# Patient Record
Sex: Female | Born: 1975
Health system: Southern US, Community
[De-identification: ages and names within clinical notes are randomized; demographics above are authoritative.]

## PROBLEM LIST (undated history)

## (undated) DIAGNOSIS — O09529 Supervision of elderly multigravida, unspecified trimester: Secondary | ICD-10-CM

## (undated) DIAGNOSIS — Z87442 Personal history of urinary calculi: Secondary | ICD-10-CM

## (undated) DIAGNOSIS — F32A Depression, unspecified: Secondary | ICD-10-CM

## (undated) DIAGNOSIS — L309 Dermatitis, unspecified: Secondary | ICD-10-CM

## (undated) DIAGNOSIS — R87629 Unspecified abnormal cytological findings in specimens from vagina: Secondary | ICD-10-CM

## (undated) DIAGNOSIS — E039 Hypothyroidism, unspecified: Secondary | ICD-10-CM

## (undated) DIAGNOSIS — F329 Major depressive disorder, single episode, unspecified: Secondary | ICD-10-CM

## (undated) HISTORY — DX: Major depressive disorder, single episode, unspecified: F32.9

## (undated) HISTORY — DX: Personal history of urinary calculi: Z87.442

## (undated) HISTORY — DX: Depression, unspecified: F32.A

## (undated) HISTORY — DX: Hypothyroidism, unspecified: E03.9

## (undated) HISTORY — DX: Supervision of elderly multigravida, unspecified trimester: O09.529

## (undated) HISTORY — PX: GYNECOLOGIC CRYOSURGERY: SHX857

## (undated) HISTORY — DX: Dermatitis, unspecified: L30.9

## (undated) HISTORY — DX: Unspecified abnormal cytological findings in specimens from vagina: R87.629

## (undated) HISTORY — PX: WISDOM TOOTH EXTRACTION: SHX21

---

## 2000-10-16 ENCOUNTER — Other Ambulatory Visit: Admission: RE | Admit: 2000-10-16 | Discharge: 2000-10-16 | Payer: Self-pay | Admitting: *Deleted

## 2001-08-19 ENCOUNTER — Other Ambulatory Visit: Admission: RE | Admit: 2001-08-19 | Discharge: 2001-08-19 | Payer: Self-pay | Admitting: *Deleted

## 2002-09-16 ENCOUNTER — Other Ambulatory Visit: Admission: RE | Admit: 2002-09-16 | Discharge: 2002-09-16 | Payer: Self-pay | Admitting: *Deleted

## 2004-09-18 ENCOUNTER — Other Ambulatory Visit: Admission: RE | Admit: 2004-09-18 | Discharge: 2004-09-18 | Payer: Self-pay | Admitting: Obstetrics and Gynecology

## 2008-05-24 ENCOUNTER — Encounter: Admission: RE | Admit: 2008-05-24 | Discharge: 2008-05-24 | Payer: Self-pay | Admitting: Family Medicine

## 2010-04-29 NOTE — L&D Delivery Note (Signed)
Delivery Note  SVD viable female Apgars 9,9 over 2nd deg MLE.  Placenta delivered spontaneously intact with 3VC. Repair with 2-0 Chromic with good support and hemostasis noted and R/V exam confirms.  PH art was done but clotted.  [place] cord blood was sent.  Mother and baby were doing well.  EBL 450  Candice Camp, MD

## 2010-08-17 LAB — ABO/RH: RH Type: NEGATIVE

## 2010-08-17 LAB — RPR: RPR: NONREACTIVE

## 2010-08-17 LAB — ANTIBODY SCREEN: Antibody Screen: NEGATIVE

## 2010-08-17 LAB — HEPATITIS B SURFACE ANTIGEN: Hepatitis B Surface Ag: NEGATIVE

## 2011-01-31 ENCOUNTER — Institutional Professional Consult (permissible substitution): Payer: Medicaid Other | Admitting: Pediatrics

## 2011-03-27 ENCOUNTER — Telehealth (HOSPITAL_COMMUNITY): Payer: Self-pay | Admitting: *Deleted

## 2011-03-27 ENCOUNTER — Encounter (HOSPITAL_COMMUNITY): Payer: Self-pay | Admitting: *Deleted

## 2011-03-27 NOTE — Telephone Encounter (Signed)
,  Preadmission screen  

## 2011-04-01 ENCOUNTER — Inpatient Hospital Stay (HOSPITAL_COMMUNITY)
Admission: AD | Admit: 2011-04-01 | Discharge: 2011-04-04 | DRG: 373 | Disposition: A | Discharge status: Home / Self Care | Payer: BC Managed Care – PPO | Source: Ambulatory Visit | Attending: Obstetrics and Gynecology | Admitting: Obstetrics and Gynecology

## 2011-04-01 ENCOUNTER — Encounter (HOSPITAL_COMMUNITY): Payer: Self-pay

## 2011-04-01 ENCOUNTER — Inpatient Hospital Stay (HOSPITAL_COMMUNITY): Admission: RE | Admit: 2011-04-01 | Payer: Medicaid Other | Source: Ambulatory Visit

## 2011-04-01 DIAGNOSIS — O09519 Supervision of elderly primigravida, unspecified trimester: Secondary | ICD-10-CM | POA: Diagnosis present

## 2011-04-01 DIAGNOSIS — O480 Post-term pregnancy: Principal | ICD-10-CM | POA: Diagnosis present

## 2011-04-01 DIAGNOSIS — E039 Hypothyroidism, unspecified: Secondary | ICD-10-CM | POA: Diagnosis present

## 2011-04-01 DIAGNOSIS — E079 Disorder of thyroid, unspecified: Secondary | ICD-10-CM | POA: Diagnosis present

## 2011-04-01 LAB — CBC
HCT: 34 % — ABNORMAL LOW (ref 36.0–46.0)
MCV: 94.7 fL (ref 78.0–100.0)
Platelets: 243 10*3/uL (ref 150–400)
RBC: 3.59 MIL/uL — ABNORMAL LOW (ref 3.87–5.11)
RDW: 12.8 % (ref 11.5–15.5)
WBC: 10.8 10*3/uL — ABNORMAL HIGH (ref 4.0–10.5)

## 2011-04-01 MED ORDER — CITRIC ACID-SODIUM CITRATE 334-500 MG/5ML PO SOLN: 30.0000 mL | ORAL | Status: DC | PRN

## 2011-04-01 MED ORDER — LACTATED RINGERS IV SOLN: 500.0000 mL | INTRAVENOUS | Status: DC | PRN

## 2011-04-01 MED ORDER — TERBUTALINE SULFATE 1 MG/ML IJ SOLN: 0.2500 mg | Freq: Once | INTRAMUSCULAR | Status: AC | PRN

## 2011-04-01 MED ORDER — FLEET ENEMA 7-19 GM/118ML RE ENEM: 1.0000 | ENEMA | RECTAL | Status: DC | PRN

## 2011-04-01 MED ORDER — LACTATED RINGERS IV SOLN
INTRAVENOUS | Status: DC
  Administered 2011-04-01 – 2011-04-02 (×4): via INTRAVENOUS

## 2011-04-01 MED ORDER — OXYCODONE-ACETAMINOPHEN 5-325 MG PO TABS: 2.0000 | ORAL_TABLET | ORAL | Status: DC | PRN

## 2011-04-01 MED ORDER — ACETAMINOPHEN 325 MG PO TABS: 650.0000 mg | ORAL_TABLET | ORAL | Status: DC | PRN

## 2011-04-01 MED ORDER — IBUPROFEN 600 MG PO TABS: 600.0000 mg | ORAL_TABLET | Freq: Four times a day (QID) | ORAL | Status: DC | PRN

## 2011-04-01 MED ORDER — OXYTOCIN BOLUS FROM INFUSION
500.0000 mL | Freq: Once | INTRAVENOUS | Status: DC
  Filled 2011-04-01: qty 500
  Filled 2011-04-01: qty 1000

## 2011-04-01 MED ORDER — OXYTOCIN 20 UNITS IN LACTATED RINGERS INFUSION - SIMPLE
125.0000 mL/h | Freq: Once | INTRAVENOUS | Status: AC
  Administered 2011-04-02: 999 mL/h via INTRAVENOUS
  Filled 2011-04-01: qty 1000

## 2011-04-01 MED ORDER — LIDOCAINE HCL (PF) 1 % IJ SOLN
30.0000 mL | INTRAMUSCULAR | Status: DC | PRN
  Administered 2011-04-02: 30 mL via SUBCUTANEOUS
  Filled 2011-04-01: qty 30

## 2011-04-01 MED ORDER — ONDANSETRON HCL 4 MG/2ML IJ SOLN: 4.0000 mg | Freq: Four times a day (QID) | INTRAMUSCULAR | Status: DC | PRN

## 2011-04-01 MED ORDER — MISOPROSTOL 25 MCG QUARTER TABLET
25.0000 ug | ORAL_TABLET | ORAL | Status: DC | PRN
  Administered 2011-04-01 – 2011-04-02 (×3): 25 ug via VAGINAL
  Filled 2011-04-01 (×3): qty 0.25

## 2011-04-01 NOTE — Plan of Care (Signed)
Problem: Consults Goal: Birthing Suites Patient Information Press F2 to bring up selections list  Outcome: Completed/Met Date Met:  04/01/11  Pt > [redacted] weeks EGA and Inpatient induction

## 2011-04-02 ENCOUNTER — Inpatient Hospital Stay (HOSPITAL_COMMUNITY): Payer: BC Managed Care – PPO | Admitting: Anesthesiology

## 2011-04-02 ENCOUNTER — Encounter (HOSPITAL_COMMUNITY): Payer: Self-pay | Admitting: Anesthesiology

## 2011-04-02 ENCOUNTER — Encounter (HOSPITAL_COMMUNITY): Payer: Self-pay | Admitting: *Deleted

## 2011-04-02 MED ORDER — DIPHENHYDRAMINE HCL 50 MG/ML IJ SOLN: 12.5000 mg | INTRAMUSCULAR | Status: DC | PRN

## 2011-04-02 MED ORDER — BENZOCAINE-MENTHOL 20-0.5 % EX AERO: 1.0000 "application " | INHALATION_SPRAY | CUTANEOUS | Status: DC | PRN

## 2011-04-02 MED ORDER — DIBUCAINE 1 % RE OINT
1.0000 "application " | TOPICAL_OINTMENT | RECTAL | Status: DC | PRN
  Filled 2011-04-02: qty 28

## 2011-04-02 MED ORDER — METHYLERGONOVINE MALEATE 0.2 MG/ML IJ SOLN
INTRAMUSCULAR | Status: AC
  Administered 2011-04-02: 0.2 mg via INTRAMUSCULAR
  Filled 2011-04-02: qty 1

## 2011-04-02 MED ORDER — MEDROXYPROGESTERONE ACETATE 150 MG/ML IM SUSP: 150.0000 mg | INTRAMUSCULAR | Status: DC | PRN

## 2011-04-02 MED ORDER — ZOLPIDEM TARTRATE 10 MG PO TABS: 10.0000 mg | ORAL_TABLET | Freq: Every evening | ORAL | Status: DC | PRN

## 2011-04-02 MED ORDER — TETANUS-DIPHTH-ACELL PERTUSSIS 5-2.5-18.5 LF-MCG/0.5 IM SUSP: 0.5000 mL | Freq: Once | INTRAMUSCULAR | Status: DC

## 2011-04-02 MED ORDER — LEVOTHYROXINE SODIUM 50 MCG PO TABS
50.0000 ug | ORAL_TABLET | Freq: Every day | ORAL | Status: DC
  Administered 2011-04-03 – 2011-04-04 (×2): 50 ug via ORAL
  Filled 2011-04-02 (×3): qty 1

## 2011-04-02 MED ORDER — ZOLPIDEM TARTRATE 5 MG PO TABS: 5.0000 mg | ORAL_TABLET | Freq: Every evening | ORAL | Status: DC | PRN

## 2011-04-02 MED ORDER — PHENYLEPHRINE 40 MCG/ML (10ML) SYRINGE FOR IV PUSH (FOR BLOOD PRESSURE SUPPORT): 80.0000 ug | PREFILLED_SYRINGE | INTRAVENOUS | Status: DC | PRN

## 2011-04-02 MED ORDER — EPHEDRINE 5 MG/ML INJ
10.0000 mg | INTRAVENOUS | Status: DC | PRN
  Filled 2011-04-02: qty 4

## 2011-04-02 MED ORDER — IBUPROFEN 600 MG PO TABS
600.0000 mg | ORAL_TABLET | Freq: Four times a day (QID) | ORAL | Status: DC
  Administered 2011-04-03 – 2011-04-04 (×6): 600 mg via ORAL
  Filled 2011-04-02 (×5): qty 1

## 2011-04-02 MED ORDER — WITCH HAZEL-GLYCERIN EX PADS: 1.0000 "application " | MEDICATED_PAD | CUTANEOUS | Status: DC | PRN

## 2011-04-02 MED ORDER — FENTANYL 2.5 MCG/ML BUPIVACAINE 1/10 % EPIDURAL INFUSION (WH - ANES)
14.0000 mL/h | INTRAMUSCULAR | Status: DC
  Administered 2011-04-02 (×2): 14 mL/h via EPIDURAL
  Filled 2011-04-02 (×3): qty 60

## 2011-04-02 MED ORDER — TERBUTALINE SULFATE 1 MG/ML IJ SOLN: 0.2500 mg | Freq: Once | INTRAMUSCULAR | Status: DC | PRN

## 2011-04-02 MED ORDER — PRENATAL PLUS 27-1 MG PO TABS
1.0000 | ORAL_TABLET | Freq: Every day | ORAL | Status: DC
  Administered 2011-04-03 – 2011-04-04 (×2): 1 via ORAL
  Filled 2011-04-02 (×2): qty 1

## 2011-04-02 MED ORDER — OXYCODONE-ACETAMINOPHEN 5-325 MG PO TABS: 1.0000 | ORAL_TABLET | ORAL | Status: DC | PRN

## 2011-04-02 MED ORDER — PRENATAL PLUS 27-1 MG PO TABS: 1.0000 | ORAL_TABLET | Freq: Every day | ORAL | Status: DC

## 2011-04-02 MED ORDER — SENNOSIDES-DOCUSATE SODIUM 8.6-50 MG PO TABS
2.0000 | ORAL_TABLET | Freq: Every day | ORAL | Status: DC
  Administered 2011-04-03: 2 via ORAL

## 2011-04-02 MED ORDER — PHENYLEPHRINE 40 MCG/ML (10ML) SYRINGE FOR IV PUSH (FOR BLOOD PRESSURE SUPPORT)
80.0000 ug | PREFILLED_SYRINGE | INTRAVENOUS | Status: DC | PRN
  Filled 2011-04-02: qty 5

## 2011-04-02 MED ORDER — LIDOCAINE HCL 1.5 % IJ SOLN
INTRAMUSCULAR | Status: DC | PRN
  Administered 2011-04-02 (×2): 5 mL via EPIDURAL

## 2011-04-02 MED ORDER — SIMETHICONE 80 MG PO CHEW: 80.0000 mg | CHEWABLE_TABLET | ORAL | Status: DC | PRN

## 2011-04-02 MED ORDER — LANOLIN HYDROUS EX OINT: TOPICAL_OINTMENT | CUTANEOUS | Status: DC | PRN

## 2011-04-02 MED ORDER — ONDANSETRON HCL 4 MG PO TABS
4.0000 mg | ORAL_TABLET | ORAL | Status: DC | PRN
  Administered 2011-04-02: 4 mg via ORAL
  Filled 2011-04-02: qty 1

## 2011-04-02 MED ORDER — ONDANSETRON HCL 4 MG/2ML IJ SOLN
4.0000 mg | INTRAMUSCULAR | Status: DC | PRN
  Filled 2011-04-02: qty 2

## 2011-04-02 MED ORDER — OXYTOCIN 20 UNITS IN LACTATED RINGERS INFUSION - SIMPLE
1.0000 m[IU]/min | INTRAVENOUS | Status: DC
  Administered 2011-04-02: 2 m[IU]/min via INTRAVENOUS
  Administered 2011-04-02: 333 m[IU]/min via INTRAVENOUS

## 2011-04-02 MED ORDER — LEVOTHYROXINE SODIUM 50 MCG PO TABS
50.0000 ug | ORAL_TABLET | Freq: Every day | ORAL | Status: DC
  Filled 2011-04-02: qty 1

## 2011-04-02 MED ORDER — FENTANYL 2.5 MCG/ML BUPIVACAINE 1/10 % EPIDURAL INFUSION (WH - ANES)
INTRAMUSCULAR | Status: DC | PRN
  Administered 2011-04-02: 14 mL/h via EPIDURAL

## 2011-04-02 MED ORDER — DIPHENHYDRAMINE HCL 25 MG PO CAPS: 25.0000 mg | ORAL_CAPSULE | Freq: Four times a day (QID) | ORAL | Status: DC | PRN

## 2011-04-02 MED ORDER — MEASLES, MUMPS & RUBELLA VAC ~~LOC~~ INJ
0.5000 mL | INJECTION | Freq: Once | SUBCUTANEOUS | Status: DC
  Filled 2011-04-02: qty 0.5

## 2011-04-02 MED ORDER — EPHEDRINE 5 MG/ML INJ: 10.0000 mg | INTRAVENOUS | Status: DC | PRN

## 2011-04-02 MED ORDER — LACTATED RINGERS IV SOLN: 500.0000 mL | Freq: Once | INTRAVENOUS | Status: DC

## 2011-04-02 NOTE — H&P (Signed)
Carly [name] is a 35 y.o. female presenting for induction of labor due to postdates.  Her pregnancy has been uncomplicated  GBS -. History OB History    Grav Para Term Preterm Abortions TAB SAB Ect Mult Living   1 0 0 0 0 0 0 0 0 0      Past Medical History  Diagnosis Date  . AMA (advanced maternal age) multigravida 35+   . Hypothyroidism   . History of kidney stones   . Eczema    Past Surgical History  Procedure Date  . Wisdom tooth extraction    Family History: family history includes Cancer in her maternal grandfather; Heart disease in her maternal grandfather; and Hypothyroidism in her paternal grandmother.  There is no history of Anesthesia problems, and Hypotension, and Malignant hyperthermia, and Pseudochol deficiency, . Social History:  reports that she has been smoking.  She has never used smokeless tobacco. She reports that she does not drink alcohol or use illicit drugs.  ROS  Dilation: 2 Effacement (%): 90 Station: -2 Exam by:: Dr. Rana Snare Blood pressure 150/82, pulse 65, temperature 98.3 F (36.8 C), temperature source Oral, resp. rate 20, height 5\' 11"  (1.803 m), weight 88.451 kg (195 lb), last menstrual period 06/19/2010. Exam Physical Exam  Prenatal labs: ABO, Rh: O/Negative/-- (04/20 0000) Antibody: Negative (04/20 0000) Rubella: Immune (04/20 0000) RPR: NON REACTIVE (12/03 2010)  HBsAg: Negative (04/20 0000)  HIV: Non-reactive (04/20 0000)  GBS: Negative (10/31 0000)   Assessment/Plan: IUP at 41` weeks Cytotec last HS AROM then pitocin Anticipate svd   Horace Wishon C 04/02/2011, 8:36 AM

## 2011-04-02 NOTE — Anesthesia Procedure Notes (Signed)
Epidural Patient location during procedure: OB Start time: 04/02/2011 9:59 AM End time: 04/02/2011 10:05 AM Reason for block: procedure for pain  Staffing Anesthesiologist: Sandrea Hughs Performed by: anesthesiologist   Preanesthetic Checklist Completed: patient identified, site marked, surgical consent, pre-op evaluation, timeout performed, IV checked, risks and benefits discussed and monitors and equipment checked  Epidural Patient position: sitting Prep: site prepped and draped and DuraPrep Patient monitoring: continuous pulse ox and blood pressure Approach: midline Injection technique: LOR air  Needle:  Needle type: Tuohy  Needle gauge: 17 G Needle length: 9 cm Needle insertion depth: 6 cm Catheter type: closed end flexible Catheter size: 19 Gauge Catheter at skin depth: 11 cm Test dose: negative and 1.5% lidocaine  Assessment Sensory level: T10 Events: blood not aspirated, injection not painful, no injection resistance, negative IV test and no paresthesia

## 2011-04-02 NOTE — Anesthesia Preprocedure Evaluation (Signed)
Anesthesia Evaluation  Patient identified by MRN, date of birth, ID band Patient awake    Reviewed: Allergy & Precautions, H&P , NPO status , Patient's Chart, lab work & pertinent test results  Airway Mallampati: I TM Distance: >3 FB Neck ROM: full    Dental No notable dental hx.    Pulmonary neg pulmonary ROS,    Pulmonary exam normal       Cardiovascular neg cardio ROS     Neuro/Psych Negative Neurological ROS  Negative Psych ROS   GI/Hepatic negative GI ROS, Neg liver ROS,   Endo/Other  Hypothyroidism   Renal/GU negative Renal ROS  Genitourinary negative   Musculoskeletal   Abdominal Normal abdominal exam  (+)   Peds negative pediatric ROS (+)  Hematology negative hematology ROS (+)   Anesthesia Other Findings   Reproductive/Obstetrics (+) Pregnancy                           Anesthesia Physical Anesthesia Plan  ASA: II  Anesthesia Plan: Epidural   Post-op Pain Management:    Induction:   Airway Management Planned:   Additional Equipment:   Intra-op Plan:   Post-operative Plan:   Informed Consent: I have reviewed the patients History and Physical, chart, labs and discussed the procedure including the risks, benefits and alternatives for the proposed anesthesia with the patient or authorized representative who has indicated his/her understanding and acceptance.     Plan Discussed with:   Anesthesia Plan Comments:         Anesthesia Quick Evaluation

## 2011-04-03 LAB — CBC
HCT: 23.7 % — ABNORMAL LOW (ref 36.0–46.0)
Hemoglobin: 8.3 g/dL — ABNORMAL LOW (ref 12.0–15.0)
MCHC: 35 g/dL (ref 30.0–36.0)

## 2011-04-03 MED ORDER — RHO D IMMUNE GLOBULIN 1500 UNIT/2ML IJ SOLN
300.0000 ug | Freq: Once | INTRAMUSCULAR | Status: AC
  Administered 2011-04-03: 300 ug via INTRAMUSCULAR
  Filled 2011-04-03: qty 2

## 2011-04-03 MED ORDER — BENZOCAINE-MENTHOL 20-0.5 % EX AERO
INHALATION_SPRAY | CUTANEOUS | Status: AC
  Filled 2011-04-03: qty 56

## 2011-04-03 NOTE — Progress Notes (Signed)
I discussed infant circumcision with mother, use of Xylocaine for dorsal penile nerve block, and risks related to bleeding, infection or need for further surgery

## 2011-04-03 NOTE — Anesthesia Postprocedure Evaluation (Signed)
  Anesthesia Post-op Note  Patient: Carly [name]  Procedure(s) Performed: * No procedures listed *  Patient Location: Mother/Baby  Anesthesia Type: Epidural  Level of Consciousness: alert  and oriented  Airway and Oxygen Therapy: Patient Spontanous Breathing  Post-op Pain: mild  Post-op Assessment: Patient's Cardiovascular Status Stable and Respiratory Function Stable  Post-op Vital Signs: stable  Complications: No apparent anesthesia complications

## 2011-04-03 NOTE — Anesthesia Postprocedure Evaluation (Signed)
Anesthesia Post Note  Patient: Carly [name]  Procedure(s) Performed: * No procedures listed *  Anesthesia type: Epidural  Patient location: Mother/Baby  Post pain: Pain level controlled  Post assessment: Post-op Vital signs reviewed  Last Vitals:  Filed Vitals:   04/03/11 0505  BP: 109/72  Pulse: 69  Temp: 36.4 C  Resp: 18    Post vital signs: Reviewed  Level of consciousness: awake  Complications: No apparent anesthesia complications

## 2011-04-03 NOTE — Progress Notes (Signed)
Post Partum Day 1 Subjective: no complaints, up ad lib, voiding and tolerating PO  Objective: Blood pressure 109/72, pulse 69, temperature 97.6 F (36.4 C), temperature source Oral, resp. rate 18, height 5\' 11"  (1.803 m), weight 88.451 kg (195 lb), last menstrual period 06/19/2010, SpO2 100.00%, unknown if currently breastfeeding.  Physical Exam:  General: alert and cooperative Lochia: appropriate Uterine Fundus: firm Perineum intact DVT Evaluation: No evidence of DVT seen on physical exam.   Basename 04/03/11 0510 04/01/11 2010  HGB 8.3* 11.5*  HCT 23.7* 34.0*    Assessment/Plan: Plan for discharge tomorrow   LOS: 2 days   Hobie Kohles G 04/03/2011, 7:53 AM

## 2011-04-04 LAB — RH IG WORKUP (INCLUDES ABO/RH)
ABO/RH(D): O NEG
Antibody Screen: POSITIVE
DAT, IgG: NEGATIVE
Fetal Screen: NEGATIVE
Unit division: 0

## 2011-04-04 NOTE — Progress Notes (Signed)
Post Partum Day 2 Subjective: no complaints, up ad lib, voiding, tolerating PO and + flatus  Objective: Blood pressure 138/86, pulse 67, temperature 98.2 F (36.8 C), temperature source Oral, resp. rate 18, height 5\' 11"  (1.803 m), weight 88.451 kg (195 lb), last menstrual period 06/19/2010, SpO2 100.00%, unknown if currently breastfeeding.  Physical Exam:  General: alert, cooperative and appears stated age Lochia: appropriate Uterine Fundus: firm Incision: not applicable DVT Evaluation: No evidence of DVT seen on physical exam.   Basename 04/03/11 0510 04/01/11 2010  HGB 8.3* 11.5*  HCT 23.7* 34.0*    Assessment/Plan: Discharge home and Breastfeeding   LOS: 3 days   Nickolis Diel L 04/04/2011, 7:43 AM

## 2011-04-04 NOTE — Discharge Summary (Signed)
Obstetric Discharge Summary Reason for Admission: onset of labor Prenatal Procedures: none Intrapartum Procedures: spontaneous vaginal delivery Postpartum Procedures: none Complications-Operative and Postpartum: none Hemoglobin  Date Value Range Status  04/03/2011 8.3* 12.0-15.0 (g/dL) Final     DELTA CHECK NOTED     REPEATED TO VERIFY     HCT  Date Value Range Status  04/03/2011 23.7* 36.0-46.0 (%) Final    Discharge Diagnoses: Term Pregnancy-delivered  Discharge Information: Date: 04/04/2011 Activity: pelvic rest Diet: routine Medications: None Condition: improved Instructions: refer to practice specific booklet Discharge to: home   Newborn Data: Live born female  Birth Weight: 7 lb 14 oz (3572 g) APGAR: 9, 9  Home with mother.  [name] L 04/04/2011, 7:44 AM

## 2013-04-29 NOTE — L&D Delivery Note (Signed)
Delivery Note At 7:11 PM a healthy female was delivered via Vaginal, Spontaneous Delivery (Presentation: Left Occiput Anterior).  APGAR: 8, 9; weight .   Placenta status: Intact, Spontaneous.  Cord: 3 vessels with the following complications: None.  Cord pH: not sent   Loose nuchal cord x 2 Anesthesia: Epidural  Episiotomy: None Lacerations: 2nd degree;Periurethral Suture Repair: 3-0 Rapide Est. Blood Loss (mL): 300  Mom to postpartum.  Baby to nursery  [facility]AND,Carly [name] M 01/26/2014, 7:42 PM

## 2013-07-07 LAB — OB RESULTS CONSOLE GC/CHLAMYDIA
Chlamydia: NEGATIVE
Gonorrhea: NEGATIVE

## 2013-07-07 LAB — OB RESULTS CONSOLE ABO/RH: RH Type: NEGATIVE

## 2013-07-07 LAB — OB RESULTS CONSOLE RPR: RPR: NONREACTIVE

## 2013-07-07 LAB — OB RESULTS CONSOLE HIV ANTIBODY (ROUTINE TESTING): HIV: NONREACTIVE

## 2013-07-07 LAB — OB RESULTS CONSOLE ANTIBODY SCREEN: Antibody Screen: NEGATIVE

## 2013-07-07 LAB — OB RESULTS CONSOLE HEPATITIS B SURFACE ANTIGEN: HEP B S AG: NEGATIVE

## 2013-07-07 LAB — OB RESULTS CONSOLE RUBELLA ANTIBODY, IGM: Rubella: IMMUNE

## 2013-11-18 ENCOUNTER — Inpatient Hospital Stay (HOSPITAL_COMMUNITY)
Admission: AD | Admit: 2013-11-18 | Payer: BC Managed Care – PPO | Source: Ambulatory Visit | Admitting: Obstetrics and Gynecology

## 2014-01-04 LAB — OB RESULTS CONSOLE GBS: STREP GROUP B AG: NEGATIVE

## 2014-01-19 ENCOUNTER — Telehealth (HOSPITAL_COMMUNITY): Payer: Self-pay | Admitting: *Deleted

## 2014-01-19 ENCOUNTER — Encounter (HOSPITAL_COMMUNITY): Payer: Self-pay | Admitting: *Deleted

## 2014-01-19 NOTE — Telephone Encounter (Signed)
Preadmission screen  

## 2014-01-26 ENCOUNTER — Inpatient Hospital Stay (HOSPITAL_COMMUNITY)
Admission: RE | Admit: 2014-01-26 | Discharge: 2014-01-28 | DRG: 775 | Disposition: A | Discharge status: Home / Self Care | Payer: BC Managed Care – PPO | Source: Ambulatory Visit | Attending: Obstetrics and Gynecology | Admitting: Obstetrics and Gynecology

## 2014-01-26 ENCOUNTER — Encounter (HOSPITAL_COMMUNITY): Payer: BC Managed Care – PPO | Admitting: Anesthesiology

## 2014-01-26 ENCOUNTER — Encounter (HOSPITAL_COMMUNITY): Payer: Self-pay

## 2014-01-26 ENCOUNTER — Inpatient Hospital Stay (HOSPITAL_COMMUNITY): Payer: BC Managed Care – PPO | Admitting: Anesthesiology

## 2014-01-26 DIAGNOSIS — E039 Hypothyroidism, unspecified: Secondary | ICD-10-CM | POA: Diagnosis present

## 2014-01-26 DIAGNOSIS — O09523 Supervision of elderly multigravida, third trimester: Secondary | ICD-10-CM

## 2014-01-26 DIAGNOSIS — O99334 Smoking (tobacco) complicating childbirth: Secondary | ICD-10-CM | POA: Diagnosis present

## 2014-01-26 DIAGNOSIS — Z3490 Encounter for supervision of normal pregnancy, unspecified, unspecified trimester: Secondary | ICD-10-CM

## 2014-01-26 DIAGNOSIS — O99284 Endocrine, nutritional and metabolic diseases complicating childbirth: Secondary | ICD-10-CM | POA: Diagnosis present

## 2014-01-26 DIAGNOSIS — Z3A40 40 weeks gestation of pregnancy: Secondary | ICD-10-CM | POA: Diagnosis present

## 2014-01-26 DIAGNOSIS — O360930 Maternal care for other rhesus isoimmunization, third trimester, not applicable or unspecified: Secondary | ICD-10-CM | POA: Diagnosis present

## 2014-01-26 DIAGNOSIS — O9989 Other specified diseases and conditions complicating pregnancy, childbirth and the puerperium: Secondary | ICD-10-CM | POA: Diagnosis present

## 2014-01-26 LAB — COMPREHENSIVE METABOLIC PANEL
ALT: 11 U/L (ref 0–35)
AST: 13 U/L (ref 0–37)
Albumin: 2.5 g/dL — ABNORMAL LOW (ref 3.5–5.2)
Alkaline Phosphatase: 82 U/L (ref 39–117)
Anion gap: 11 (ref 5–15)
BUN: 4 mg/dL — AB (ref 6–23)
CALCIUM: 8.9 mg/dL (ref 8.4–10.5)
CHLORIDE: 104 meq/L (ref 96–112)
CO2: 24 mEq/L (ref 19–32)
CREATININE: 0.61 mg/dL (ref 0.50–1.10)
GLUCOSE: 76 mg/dL (ref 70–99)
Potassium: 3.4 mEq/L — ABNORMAL LOW (ref 3.7–5.3)
Sodium: 139 mEq/L (ref 137–147)
Total Bilirubin: 0.3 mg/dL (ref 0.3–1.2)
Total Protein: 5.4 g/dL — ABNORMAL LOW (ref 6.0–8.3)

## 2014-01-26 LAB — CBC
HCT: 30.5 % — ABNORMAL LOW (ref 36.0–46.0)
HCT: 29.4 % — ABNORMAL LOW (ref 36.0–46.0)
HEMATOCRIT: 31.7 % — AB (ref 36.0–46.0)
HEMOGLOBIN: 11 g/dL — AB (ref 12.0–15.0)
Hemoglobin: 10.4 g/dL — ABNORMAL LOW (ref 12.0–15.0)
Hemoglobin: 10 g/dL — ABNORMAL LOW (ref 12.0–15.0)
MCH: 32.5 pg (ref 26.0–34.0)
MCH: 32.3 pg (ref 26.0–34.0)
MCH: 32.1 pg (ref 26.0–34.0)
MCHC: 34.7 g/dL (ref 30.0–36.0)
MCHC: 34.1 g/dL (ref 30.0–36.0)
MCHC: 34 g/dL (ref 30.0–36.0)
MCV: 93.8 fL (ref 78.0–100.0)
MCV: 94.7 fL (ref 78.0–100.0)
MCV: 94.2 fL (ref 78.0–100.0)
PLATELETS: 160 10*3/uL (ref 150–400)
Platelets: 179 10*3/uL (ref 150–400)
Platelets: 162 10*3/uL (ref 150–400)
RBC: 3.38 MIL/uL — ABNORMAL LOW (ref 3.87–5.11)
RBC: 3.22 MIL/uL — AB (ref 3.87–5.11)
RBC: 3.12 MIL/uL — ABNORMAL LOW (ref 3.87–5.11)
RDW: 12.9 % (ref 11.5–15.5)
RDW: 12.9 % (ref 11.5–15.5)
RDW: 12.9 % (ref 11.5–15.5)
WBC: 8.5 10*3/uL (ref 4.0–10.5)
WBC: 7.1 10*3/uL (ref 4.0–10.5)
WBC: 13.7 10*3/uL — ABNORMAL HIGH (ref 4.0–10.5)

## 2014-01-26 LAB — TYPE AND SCREEN
ABO/RH(D): O NEG
ANTIBODY SCREEN: NEGATIVE

## 2014-01-26 LAB — RPR

## 2014-01-26 MED ORDER — PHENYLEPHRINE 40 MCG/ML (10ML) SYRINGE FOR IV PUSH (FOR BLOOD PRESSURE SUPPORT)
80.0000 ug | PREFILLED_SYRINGE | INTRAVENOUS | Status: DC | PRN
  Filled 2014-01-26: qty 2
  Filled 2014-01-26: qty 10

## 2014-01-26 MED ORDER — SENNOSIDES-DOCUSATE SODIUM 8.6-50 MG PO TABS
2.0000 | ORAL_TABLET | ORAL | Status: DC
  Administered 2014-01-27 – 2014-01-28 (×2): 2 via ORAL
  Filled 2014-01-26 (×2): qty 2

## 2014-01-26 MED ORDER — LACTATED RINGERS IV SOLN: 500.0000 mL | INTRAVENOUS | Status: DC | PRN

## 2014-01-26 MED ORDER — LACTATED RINGERS IV SOLN
500.0000 mL | Freq: Once | INTRAVENOUS | Status: AC
  Administered 2014-01-26: 500 mL via INTRAVENOUS

## 2014-01-26 MED ORDER — ONDANSETRON HCL 4 MG/2ML IJ SOLN: 4.0000 mg | INTRAMUSCULAR | Status: DC | PRN

## 2014-01-26 MED ORDER — OXYTOCIN 40 UNITS IN LACTATED RINGERS INFUSION - SIMPLE MED: 1.0000 m[IU]/min | INTRAVENOUS | Status: DC

## 2014-01-26 MED ORDER — ONDANSETRON HCL 4 MG PO TABS
4.0000 mg | ORAL_TABLET | ORAL | Status: DC | PRN
Start: 2014-01-26 — End: 2014-01-28

## 2014-01-26 MED ORDER — INFLUENZA VAC SPLIT QUAD 0.5 ML IM SUSY
0.5000 mL | PREFILLED_SYRINGE | INTRAMUSCULAR | Status: AC
  Administered 2014-01-27: 0.5 mL via INTRAMUSCULAR
  Filled 2014-01-26: qty 0.5

## 2014-01-26 MED ORDER — OXYCODONE-ACETAMINOPHEN 5-325 MG PO TABS: 2.0000 | ORAL_TABLET | ORAL | Status: DC | PRN

## 2014-01-26 MED ORDER — LANOLIN HYDROUS EX OINT: TOPICAL_OINTMENT | CUTANEOUS | Status: DC | PRN

## 2014-01-26 MED ORDER — OXYCODONE-ACETAMINOPHEN 5-325 MG PO TABS
1.0000 | ORAL_TABLET | ORAL | Status: DC | PRN
  Administered 2014-01-28: 1 via ORAL
  Filled 2014-01-26: qty 1

## 2014-01-26 MED ORDER — LIDOCAINE HCL (PF) 1 % IJ SOLN
30.0000 mL | INTRAMUSCULAR | Status: DC | PRN
Start: 2014-01-26 — End: 2014-01-26
  Filled 2014-01-26: qty 30

## 2014-01-26 MED ORDER — OXYTOCIN BOLUS FROM INFUSION
500.0000 mL | INTRAVENOUS | Status: DC
  Administered 2014-01-26: 500 mL via INTRAVENOUS

## 2014-01-26 MED ORDER — BISACODYL 10 MG RE SUPP: 10.0000 mg | Freq: Every day | RECTAL | Status: DC | PRN

## 2014-01-26 MED ORDER — PHENYLEPHRINE 40 MCG/ML (10ML) SYRINGE FOR IV PUSH (FOR BLOOD PRESSURE SUPPORT)
80.0000 ug | PREFILLED_SYRINGE | INTRAVENOUS | Status: DC | PRN
  Filled 2014-01-26: qty 2

## 2014-01-26 MED ORDER — DIBUCAINE 1 % RE OINT
1.0000 "application " | TOPICAL_OINTMENT | RECTAL | Status: DC | PRN
  Administered 2014-01-27: 1 via RECTAL
  Filled 2014-01-26: qty 28

## 2014-01-26 MED ORDER — DIPHENHYDRAMINE HCL 25 MG PO CAPS: 25.0000 mg | ORAL_CAPSULE | Freq: Four times a day (QID) | ORAL | Status: DC | PRN

## 2014-01-26 MED ORDER — LIDOCAINE HCL (PF) 1 % IJ SOLN
INTRAMUSCULAR | Status: DC | PRN
  Administered 2014-01-26: 6 mL
  Administered 2014-01-26: 4 mL

## 2014-01-26 MED ORDER — EPHEDRINE 5 MG/ML INJ
10.0000 mg | INTRAVENOUS | Status: DC | PRN
  Filled 2014-01-26: qty 2

## 2014-01-26 MED ORDER — ONDANSETRON HCL 4 MG/2ML IJ SOLN: 4.0000 mg | Freq: Four times a day (QID) | INTRAMUSCULAR | Status: DC | PRN

## 2014-01-26 MED ORDER — BENZOCAINE-MENTHOL 20-0.5 % EX AERO
1.0000 "application " | INHALATION_SPRAY | CUTANEOUS | Status: DC | PRN
  Administered 2014-01-26: 1 via TOPICAL
  Filled 2014-01-26: qty 56

## 2014-01-26 MED ORDER — WITCH HAZEL-GLYCERIN EX PADS
1.0000 "application " | MEDICATED_PAD | CUTANEOUS | Status: DC | PRN
  Administered 2014-01-27: 1 via TOPICAL

## 2014-01-26 MED ORDER — LACTATED RINGERS IV SOLN
INTRAVENOUS | Status: DC
  Administered 2014-01-26 (×3): via INTRAVENOUS

## 2014-01-26 MED ORDER — PRENATAL MULTIVITAMIN CH
1.0000 | ORAL_TABLET | Freq: Every day | ORAL | Status: DC
  Administered 2014-01-27: 1 via ORAL
  Filled 2014-01-26: qty 1

## 2014-01-26 MED ORDER — SIMETHICONE 80 MG PO CHEW: 80.0000 mg | CHEWABLE_TABLET | ORAL | Status: DC | PRN

## 2014-01-26 MED ORDER — OXYTOCIN 40 UNITS IN LACTATED RINGERS INFUSION - SIMPLE MED
62.5000 mL/h | INTRAVENOUS | Status: DC
  Administered 2014-01-26: 62.5 mL/h via INTRAVENOUS
  Filled 2014-01-26: qty 1000

## 2014-01-26 MED ORDER — ZOLPIDEM TARTRATE 5 MG PO TABS: 5.0000 mg | ORAL_TABLET | Freq: Every evening | ORAL | Status: DC | PRN

## 2014-01-26 MED ORDER — OXYCODONE-ACETAMINOPHEN 5-325 MG PO TABS: 1.0000 | ORAL_TABLET | ORAL | Status: DC | PRN

## 2014-01-26 MED ORDER — ACETAMINOPHEN 325 MG PO TABS: 650.0000 mg | ORAL_TABLET | ORAL | Status: DC | PRN

## 2014-01-26 MED ORDER — MEASLES, MUMPS & RUBELLA VAC ~~LOC~~ INJ: 0.5000 mL | INJECTION | Freq: Once | SUBCUTANEOUS | Status: DC

## 2014-01-26 MED ORDER — FLEET ENEMA 7-19 GM/118ML RE ENEM: 1.0000 | ENEMA | RECTAL | Status: DC | PRN

## 2014-01-26 MED ORDER — IBUPROFEN 800 MG PO TABS
800.0000 mg | ORAL_TABLET | Freq: Three times a day (TID) | ORAL | Status: DC | PRN
  Administered 2014-01-26 – 2014-01-28 (×4): 800 mg via ORAL
  Filled 2014-01-26 (×4): qty 1

## 2014-01-26 MED ORDER — TETANUS-DIPHTH-ACELL PERTUSSIS 5-2.5-18.5 LF-MCG/0.5 IM SUSP: 0.5000 mL | Freq: Once | INTRAMUSCULAR | Status: DC

## 2014-01-26 MED ORDER — TERBUTALINE SULFATE 1 MG/ML IJ SOLN: 0.2500 mg | Freq: Once | INTRAMUSCULAR | Status: DC | PRN

## 2014-01-26 MED ORDER — DIPHENHYDRAMINE HCL 50 MG/ML IJ SOLN: 12.5000 mg | INTRAMUSCULAR | Status: DC | PRN

## 2014-01-26 MED ORDER — CITRIC ACID-SODIUM CITRATE 334-500 MG/5ML PO SOLN: 30.0000 mL | ORAL | Status: DC | PRN

## 2014-01-26 MED ORDER — FENTANYL 2.5 MCG/ML BUPIVACAINE 1/10 % EPIDURAL INFUSION (WH - ANES)
14.0000 mL/h | INTRAMUSCULAR | Status: DC | PRN
  Administered 2014-01-26: 14 mL/h via EPIDURAL
  Filled 2014-01-26: qty 125

## 2014-01-26 MED ORDER — FENTANYL 2.5 MCG/ML BUPIVACAINE 1/10 % EPIDURAL INFUSION (WH - ANES): 16.0000 mL/h | INTRAMUSCULAR | Status: DC | PRN

## 2014-01-26 MED ORDER — FLEET ENEMA 7-19 GM/118ML RE ENEM: 1.0000 | ENEMA | Freq: Every day | RECTAL | Status: DC | PRN

## 2014-01-26 MED ORDER — LEVOTHYROXINE SODIUM 75 MCG PO TABS
75.0000 ug | ORAL_TABLET | Freq: Every day | ORAL | Status: DC
  Administered 2014-01-27 – 2014-01-28 (×2): 75 ug via ORAL
  Filled 2014-01-26 (×2): qty 1

## 2014-01-26 MED ORDER — OXYTOCIN 40 UNITS IN LACTATED RINGERS INFUSION - SIMPLE MED
1.0000 m[IU]/min | INTRAVENOUS | Status: DC
  Administered 2014-01-26: 1 m[IU]/min via INTRAVENOUS

## 2014-01-26 NOTE — Anesthesia Preprocedure Evaluation (Signed)

## 2014-01-26 NOTE — Consult Note (Signed)
NAME:  Carly [name], Carly [name]               ACCOUNT NO.:  [id number]  MEDICAL RECORD NO.:  [id number]  LOCATION:                                 FACILITY:  PHYSICIAN:  Fawzi Melman M. Delorise Hunkele, M.D.DATE OF BIRTH:  04/19/1976  DATE OF CONSULTATION: DATE OF DISCHARGE:                                CONSULTATION   CHIEF COMPLAINT:  For labor induction, term, favorable cervix.  HPI:  A 37-year-old, G2, P1, EDD January 26, 2014, presents for AROM induction with a favorable cervix.  GBS was negative.  Last ultrasound 9/28.  EFW 7 pounds 15 ounces.  BPP was 8/8.  She is Rh negative.  Her panorama screen in early pregnancy was normal, also has history of hypothyroidism with normal intrapartum thyroid profile.  PAST MEDICAL HISTORY:  Allergies:  None. Obstetrical History:  Vaginal delivery 7 pounds 14 ounce female in 2012. Please see the [facility] form for the remainder of the past medical history.  PHYSICAL EXAMINATION:  VITAL SIGNS:  Temp 98.2, blood pressure 118/80. HEENT:  Unremarkable. NECK:  Supple without masses. LUNGS:  Clear. CARDIOVASCULAR:  Regular rate and rhythm without murmurs, rubs, or gallops noted. BREASTS:  Not examined. PELVIC:  Term fundal height.  Fetal heart rate 140.  Cervix was 4, 70% vertex, -2. EXTREMITIES:  Unremarkable. NEUROLOGIC:  Unremarkable.  IMPRESSION:  Term pregnancy, favorable cervix, GBS negative.  PLAN:  For AROM, labor induction.  Procedure and protocol discussed with patient.     Zakhari Fogel M. Kyaire Gruenewald, M.D.     RMH/MEDQ  D:  01/26/2014  T:  01/26/2014  Job:  [id number] 

## 2014-01-26 NOTE — Anesthesia Procedure Notes (Signed)
Epidural Patient location during procedure: OB  Preanesthetic Checklist Completed: patient identified, site marked, surgical consent, pre-op evaluation, timeout performed, IV checked, risks and benefits discussed and monitors and equipment checked  Epidural Patient position: sitting Prep: site prepped and draped and DuraPrep Patient monitoring: continuous pulse ox and blood pressure Approach: midline Injection technique: LOR air  Needle:  Needle type: Tuohy  Needle gauge: 17 G Needle length: 9 cm and 9 Needle insertion depth: 5 cm cm Catheter type: closed end flexible Catheter size: 19 Gauge Catheter at skin depth: 11 cm Test dose: negative  Assessment Events: blood not aspirated, injection not painful, no injection resistance, negative IV test and no paresthesia  Additional Notes Dosing of Epidural:  1st dose, through catheter .............................................  Xylocaine 40 mg  2nd dose, through catheter, after waiting 3 minutes.........Xylocaine 60 mg    ( 1% Xylo charted as a single dose in Epic Meds for ease of charting; actual dosing was fractionated as above, for saftey's sake)  As each dose occurred, patient was free of IV sx; and patient exhibited no evidence of SA injection.  Patient is more comfortable after epidural dosed. Please see RN's note for documentation of vital signs,and FHR which are stable.  Patient reminded not to try to ambulate with numb legs, and that an RN must be present when she attempts to get up.       

## 2014-01-26 NOTE — H&P (Signed)
Isidore MoosBrandy M Walker  DICTATION # [id number] CSN# [id number]   Meriel PicaHOLLAND,Kingston Guiles M, MD 01/26/2014 7:32 AM

## 2014-01-26 NOTE — Consult Note (Deleted)
NAMGenella Mech:  WALKER, Glorine               ACCOUNT NO.:  [id number]  MEDICAL RECORD NO.:  [id number]  LOCATION:                                 FACILITY:  PHYSICIAN:  Duke Salviaichard M. Marcelle OverlieHolland, M.D.DATE OF BIRTH:  04/19/1976  DATE OF CONSULTATION: DATE OF DISCHARGE:                                CONSULTATION   CHIEF COMPLAINT:  For labor induction, term, favorable cervix.  HPI:  A 38 year old, G2, 13P1, EDD January 26, 2014, presents for AROM induction with a favorable cervix.  GBS was negative.  Last ultrasound 9/28.  EFW 7 pounds 15 ounces.  BPP was 8/8.  She is Rh negative.  Her panorama screen in early pregnancy was normal, also has history of hypothyroidism with normal intrapartum thyroid profile.  PAST MEDICAL HISTORY:  Allergies:  None. Obstetrical History:  Vaginal delivery 7 pounds 14 ounce female in 2012. Please see the [facility] form for the remainder of the past medical history.  PHYSICAL EXAMINATION:  VITAL SIGNS:  Temp 98.2, blood pressure 118/80. HEENT:  Unremarkable. NECK:  Supple without masses. LUNGS:  Clear. CARDIOVASCULAR:  Regular rate and rhythm without murmurs, rubs, or gallops noted. BREASTS:  Not examined. PELVIC:  Term fundal height.  Fetal heart rate 140.  Cervix was 4, 70% vertex, -2. EXTREMITIES:  Unremarkable. NEUROLOGIC:  Unremarkable.  IMPRESSION:  Term pregnancy, favorable cervix, GBS negative.  PLAN:  For AROM, labor induction.  Procedure and protocol discussed with patient.     Chablis Losh M. Marcelle OverlieHolland, M.D.     RMH/MEDQ  D:  01/26/2014  T:  01/26/2014  Job:  [id number]

## 2014-01-27 LAB — CBC
HEMATOCRIT: 26.7 % — AB (ref 36.0–46.0)
HEMOGLOBIN: 9.3 g/dL — AB (ref 12.0–15.0)
MCH: 33.1 pg (ref 26.0–34.0)
MCHC: 34.8 g/dL (ref 30.0–36.0)
MCV: 95 fL (ref 78.0–100.0)
Platelets: 161 10*3/uL (ref 150–400)
RBC: 2.81 MIL/uL — AB (ref 3.87–5.11)
RDW: 12.8 % (ref 11.5–15.5)
WBC: 8.2 10*3/uL (ref 4.0–10.5)

## 2014-01-27 NOTE — Progress Notes (Signed)
Post Partum Day 1 Subjective: no complaints, up ad lib, voiding and tolerating PO  Objective: Blood pressure 123/54, pulse 68, temperature 97.8 F (36.6 C), temperature source Oral, resp. rate 17, height 5\' 11"  (1.803 m), weight 204 lb (92.534 kg), SpO2 99.00%, unknown if currently breastfeeding.  Physical Exam:  General: alert and cooperative Lochia: appropriate Uterine Fundus: firm Incision: perineum intact DVT Evaluation: No evidence of DVT seen on physical exam. Negative Homan's sign. No cords or calf tenderness. No significant calf/ankle edema.   Recent Labs  01/26/14 2040 01/27/14 0615  HGB 10.0* 9.3*  HCT 29.4* 26.7*    Assessment/Plan: Plan for discharge tomorrow   LOS: 1 day   Claudia Greenley G 01/27/2014, 9:05 AM

## 2014-01-27 NOTE — Lactation Note (Signed)
This note was copied from the chart of Carly [name]. Lactation Consultation Note  Patient Name: Carly [name] ZOXWR'UToday's Date: 01/27/2014 Reason for consult: Initial assessment Mom had baby latched in cradle hold when I arrived. Baby did appear to have good depth, lips well flanged. Mom concerned because baby starting to cluster feed. Reviewed normal newborn behaviors with parents. Reviewed importance of depth with latch to encouraged milk production, prevent nipple trauma (Mom stopped BF 1st baby after 4 days due to cracked/bleeding nipples). No nipple breakdown noted at this visit, advised Mom if nipples become sore, apply EBM. Basic teaching reviewed with parents, cluster feeding discussed. Mom has supplemented. She reports she plans to BF for 6 weeks then formula/bottle feed when returning to work. Discussed ways to transition to bottle before returning to work. Discussed risk of early supplementation to BF success. Advised Mom if she continues to supplement now, BF with each feeding before giving any supplements. Guidelines per hours of age given to Mom. Lactation brochure left for review, advised of OP services and support group. Encouraged to call for assist with latch.   Maternal Data Formula Feeding for Exclusion: No Has patient been taught Hand Expression?: Yes Does the patient have breastfeeding experience prior to this delivery?: Yes  Feeding Feeding Type: Breast Fed Length of feed: 60 min  LATCH Score/Interventions Latch: Grasps breast easily, tongue down, lips flanged, rhythmical sucking. Intervention(s): Adjust position;Assist with latch;Breast compression  Audible Swallowing: A few with stimulation  Type of Nipple: Everted at rest and after stimulation  Comfort (Breast/Nipple): Filling, red/small blisters or bruises, mild/mod discomfort     Hold (Positioning): Assistance needed to correctly position infant at breast and maintain latch.  LATCH Score:  7  Lactation Tools Discussed/Used WIC Program: No   Consult Status Consult Status: Follow-up Date: 01/28/14 Follow-up type: In-patient    Alfred LevinsGranger, Diontay Rosencrans Ann 01/27/2014, 2:27 PM

## 2014-01-27 NOTE — Anesthesia Postprocedure Evaluation (Signed)
Anesthesia Post Note  Patient: Carly MoosBrandy M Walker  Procedure(s) Performed: * No procedures listed *  Anesthesia type: Epidural  Patient location: Mother/Baby  Post pain: Pain level controlled  Post assessment: Post-op Vital signs reviewed  Last Vitals:  Filed Vitals:   01/27/14 0320  BP: 123/54  Pulse: 68  Temp: 36.6 C  Resp: 17    Post vital signs: Reviewed  Level of consciousness: awake  Complications: No apparent anesthesia complications

## 2014-01-27 NOTE — Progress Notes (Signed)
Clinical Social Work Department PSYCHOSOCIAL ASSESSMENT - MATERNAL/CHILD 01/27/2014  Patient:  [name],[name]  Account Number:  [id number]  Admit Date:  01/26/2014  Childs Name:   Carly [name]   Clinical Social Worker:  Meridee Branum, CLINICAL SOCIAL WORKER   Date/Time:  01/27/2014 10:15 AM  Date Referred:  01/26/2014   Referral source  Central Nursery     Referred reason  Depression/Anxiety   Other referral source:    I:  FAMILY / HOME ENVIRONMENT Child's legal guardian:  PARENT  Guardian - Name Guardian - Age Guardian - Address  Carly [name] 37 [address] [place], [place] [address]  Carly [name]  same as above   Other household support members/support persons Name Relationship DOB  Carly [name] SON December 2012   Other support:   MOB stated that she has numerous friends who are supportive, but stated that she often struggles to ask for help since she does not want to be a burden. MOB stated that her mother is very supportive and provides child care while she and the FOB are working.    II  PSYCHOSOCIAL DATA Information Source:  Family Interview  Financial and Community Resources Employment:   MOB stated that she works for [employer].  The FOB is currently in school studying mechanical engineering.  Per FOB, he will be graduating in May and will be looking for employement at that time.   Financial resources:  Private Insurance If Medicaid - County:    School / Grade:  N/A Maternity Care Coordinator / Child Services Coordination / Early Interventions:   N/A  Cultural issues impacting care:   None reported.    III  STRENGTHS Strengths  Adequate Resources  Home prepared for Child (including basic supplies)  Supportive family/friends   Strength comment:    IV  RISK FACTORS AND CURRENT PROBLEMS Current Problem:  YES   Risk Factor & Current Problem Patient Issue Family Issue Risk Factor / Current Problem Comment  Mental Illness Y N MOB presents with a  mental health history signficiant for postpartum depression/anxiety. Per MOB, her symptoms did not extend beyond the postpartum period.    V  SOCIAL WORK ASSESSMENT CSW met with the MOB in her room in order to complete the assessment. Consult was ordered due to MOB presenting with a history of postpartum depression. MOB provided consent for the FOB to be present during the assessment.  MOB and FOB were easily engaged and receptive to the visit. MOB presented full range in affect and presented in a pleasant mood.  She smiled frequently and openly discussed her mental health history during her previous postpartum period.  MOB did not present with any acute mental health symptoms.    MOB expressed excitement to have a daughter, and shared that their family and friends are excited and looking forward to having a little girl in the family.  MOB denied recent acute stressors, and shared belief that she is more prepared to transition into the postpartum period in comparison to 2012.  MOB reflected upon previous postpartum which included multiple life transitions in a short period of time including starting a new relationship with the FOB, moving in together, and then having a baby.  CSW continued to explore with MOB her prior postpartum mental health history.  MOB shared that she felt overwhelmed when she attempted to balance going to work with sleep deprived nights.  She stated that it was difficult for her to adjust.  MOB shared that she   spoke to her MD and that he prescribed her Xanax in order to help her "take the edge of and sleep".  She stated that she only needed the medications for a short period of time while she continued to adjust to having a newborn.  CSW validated the challenges associated with making the transition to motherhood.  Without prompting, MOB shared gained perspective about motherhood, including her belief that she is able to balance multiple responsibilities.  She stated that she feels less  anxious since she knows what to expect with typical newborn behaviors.  MOB denied concerns related to current transition to the postpartum period and denied any mental health symptoms once she adjusted to being a mother.    CSW continued to assist MOB and FOB explore normative thoughts, feelings, and behaviors that their son may experience as he adjusts to being a big brother. FOB shared that he realizes that it will be an adjustment, and was receptive to feedback on how to help him make the transition.   No barriers to discharge.  VI SOCIAL WORK PLAN Social Work Plan  Patient/Family Education  No Further Intervention Required / No Barriers to Discharge   Type of pt/family education:   Postpartum depression and anxiety   If child protective services report - county:   If child protective services report - date:   Information/referral to community resources comment:   No referrals needed at this time.   Other social work plan:   CSW to provide ongoing emotional support PRN.     

## 2014-01-27 NOTE — Plan of Care (Signed)
Problem: Phase II Progression Outcomes Goal: Rh isoimmunization per orders Outcome: Not Applicable Date Met:  40/69/86 Rho not needed.  Pt and baby are both O-

## 2014-01-28 MED ORDER — IBUPROFEN 800 MG PO TABS: 800.0000 mg | ORAL_TABLET | Freq: Three times a day (TID) | ORAL | Status: AC | PRN

## 2014-01-28 MED ORDER — OXYCODONE-ACETAMINOPHEN 5-325 MG PO TABS: 1.0000 | ORAL_TABLET | Freq: Four times a day (QID) | ORAL | Status: AC | PRN

## 2014-01-28 NOTE — Lactation Note (Signed)
This note was copied from the chart of Carly [name]. Lactation Consultation Note  Patient Name: Carly [name] ZOXWR'UToday's Date: 01/28/2014 Reason for consult: Follow-up assessment;Other (Comment) (see LC note ) Per mom I only plan to breast feed for a few weeks , cuz I have to return to work and I definitely do not want  To pump . Baby awake and rooting , LC offered to assist mom with latch. ( per mom having difficulty latching on the right breast )  Lc reviewed basics, Steps for latching - breast massage , hand express, pr pump with hand pump if needed, and reverse pressure  Due to some swelling at the base of the nipple. Latch with breast compressions until swallows and and mom is comfortable. Showed mom football , and cross cradle . Per mom I really prefer the cradle position, so mom switched the position to cradle and  The baby was on and off at 1st trying to get connected . LC stressed allowing her to latch herself on can cause sore nipples, and  Frustration on the baby's part and a swallow latch delaying milk coming in. @ the consult baby seemed sluggish and non - nutritive on and off with feeding. Stressed both to mom and dad the baby needs to feed by 3 hours , and in the next days need to see increased wets , stools and stool color changing. Offered a SNS with formula ( mom has already started supplement aliitle bit with formula) , per mom felt the SNS seems so unnatural.  Per dad felt the 60 -90 feedings baby was spending time latched and just sitting.  Discussed prevention and tx of sore nipples, instructed mom on the use of shells due to swollen swelling at the base of the areolas, per mom has comfort gels at home. No breakdown of the nipple noted. Also instructed on use hand pump.Mother informed of post-discharge support and given phone number to the lactation department, including  services for phone call assistance; out-patient appointments; and breastfeeding support group. List  of other breastfeeding resources in the community given in the handout.  Encouraged mother to call for problems or concerns related to breastfeeding. Extra diary sheets provided with instructions to parents to keep tract for 2 weeks.     Maternal Data Has patient been taught Hand Expression?: Yes  Feeding Feeding Type: Breast Fed Length of feed:  (sluggish , and on and off during the feeding , noted swallows when the baby was in a pattern )  LATCH Score/Interventions Latch: Grasps breast easily, tongue down, lips flanged, rhythmical sucking. (Right breast , football ) Intervention(s): Adjust position;Assist with latch;Breast massage;Breast compression  Audible Swallowing: A few with stimulation  Type of Nipple: Everted at rest and after stimulation (swelling at the base of the areola )  Comfort (Breast/Nipple): Soft / non-tender     Hold (Positioning): Assistance needed to correctly position infant at breast and maintain latch. Intervention(s): Breastfeeding basics reviewed;Support Pillows;Position options;Skin to skin (see LC note )  LATCH Score: 8  Lactation Tools Discussed/Used Pump Review: Setup, frequency, and cleaning Initiated by:: MAI  Date initiated:: 01/28/14   Consult Status Consult Status: Complete Date: 01/28/14    Kathrin Greathouseorio, Myrla Malanowski Ann 01/28/2014, 11:44 AM

## 2014-01-28 NOTE — Discharge Summary (Signed)
Obstetric Discharge Summary Reason for Admission: induction of labor Prenatal Procedures: ultrasound Intrapartum Procedures: spontaneous vaginal delivery Postpartum Procedures: none Complications-Operative and Postpartum: 2 degree perineal laceration Hemoglobin  Date Value Ref Range Status  01/27/2014 9.3* 12.0 - 15.0 g/dL Final     HCT  Date Value Ref Range Status  01/27/2014 26.7* 36.0 - 46.0 % Final    Physical Exam:  General: alert and cooperative Lochia: appropriate Uterine Fundus: firm Incision: perineum intact DVT Evaluation: No evidence of DVT seen on physical exam. Negative Homan's sign. No cords or calf tenderness. No significant calf/ankle edema.  Discharge Diagnoses: Term Pregnancy-delivered  Discharge Information: Date: 01/28/2014 Activity: pelvic rest Diet: routine Medications: PNV and Ibuprofen Condition: stable Instructions: refer to practice specific booklet Discharge to: home   Newborn Data: Live born female  Birth Weight: 7 lb 12.6 oz (3532 g) APGAR: 8, 9  Home with mother.  Dossie Swor G 01/28/2014, 8:36 AM

## 2014-02-28 ENCOUNTER — Encounter (HOSPITAL_COMMUNITY): Payer: Self-pay

## 2015-08-30 DIAGNOSIS — E039 Hypothyroidism, unspecified: Secondary | ICD-10-CM | POA: Diagnosis not present

## 2015-08-30 DIAGNOSIS — M6248 Contracture of muscle, other site: Secondary | ICD-10-CM | POA: Diagnosis not present

## 2015-08-30 DIAGNOSIS — H9190 Unspecified hearing loss, unspecified ear: Secondary | ICD-10-CM | POA: Diagnosis not present

## 2015-09-06 DIAGNOSIS — E039 Hypothyroidism, unspecified: Secondary | ICD-10-CM | POA: Diagnosis not present

## 2015-09-06 DIAGNOSIS — Z0000 Encounter for general adult medical examination without abnormal findings: Secondary | ICD-10-CM | POA: Diagnosis not present

## 2015-09-18 DIAGNOSIS — E039 Hypothyroidism, unspecified: Secondary | ICD-10-CM | POA: Diagnosis not present

## 2015-12-07 DIAGNOSIS — E039 Hypothyroidism, unspecified: Secondary | ICD-10-CM | POA: Diagnosis not present

## 2015-12-07 DIAGNOSIS — F411 Generalized anxiety disorder: Secondary | ICD-10-CM | POA: Diagnosis not present

## 2015-12-07 DIAGNOSIS — R55 Syncope and collapse: Secondary | ICD-10-CM | POA: Diagnosis not present

## 2015-12-07 DIAGNOSIS — F410 Panic disorder [episodic paroxysmal anxiety] without agoraphobia: Secondary | ICD-10-CM | POA: Diagnosis not present

## 2016-02-27 DIAGNOSIS — J029 Acute pharyngitis, unspecified: Secondary | ICD-10-CM | POA: Diagnosis not present

## 2016-02-27 DIAGNOSIS — J040 Acute laryngitis: Secondary | ICD-10-CM | POA: Diagnosis not present

## 2016-04-11 DIAGNOSIS — L648 Other androgenic alopecia: Secondary | ICD-10-CM | POA: Diagnosis not present

## 2016-06-13 DIAGNOSIS — Z1231 Encounter for screening mammogram for malignant neoplasm of breast: Secondary | ICD-10-CM | POA: Diagnosis not present

## 2016-06-13 DIAGNOSIS — Z01419 Encounter for gynecological examination (general) (routine) without abnormal findings: Secondary | ICD-10-CM | POA: Diagnosis not present

## 2016-06-13 DIAGNOSIS — Z6825 Body mass index (BMI) 25.0-25.9, adult: Secondary | ICD-10-CM | POA: Diagnosis not present

## 2016-08-17 DIAGNOSIS — L02234 Carbuncle of groin: Secondary | ICD-10-CM | POA: Diagnosis not present

## 2016-08-21 DIAGNOSIS — L02214 Cutaneous abscess of groin: Secondary | ICD-10-CM | POA: Diagnosis not present

## 2016-09-18 DIAGNOSIS — R109 Unspecified abdominal pain: Secondary | ICD-10-CM | POA: Diagnosis not present

## 2016-10-14 DIAGNOSIS — R1030 Lower abdominal pain, unspecified: Secondary | ICD-10-CM | POA: Diagnosis not present

## 2016-10-14 DIAGNOSIS — R140 Abdominal distension (gaseous): Secondary | ICD-10-CM | POA: Diagnosis not present

## 2016-10-14 DIAGNOSIS — R198 Other specified symptoms and signs involving the digestive system and abdomen: Secondary | ICD-10-CM | POA: Diagnosis not present

## 2016-10-14 DIAGNOSIS — K580 Irritable bowel syndrome with diarrhea: Secondary | ICD-10-CM | POA: Diagnosis not present

## 2016-11-27 DIAGNOSIS — K589 Irritable bowel syndrome without diarrhea: Secondary | ICD-10-CM | POA: Diagnosis not present

## 2017-05-14 DIAGNOSIS — R030 Elevated blood-pressure reading, without diagnosis of hypertension: Secondary | ICD-10-CM | POA: Diagnosis not present

## 2017-05-14 DIAGNOSIS — E039 Hypothyroidism, unspecified: Secondary | ICD-10-CM | POA: Diagnosis not present

## 2017-07-17 DIAGNOSIS — M79662 Pain in left lower leg: Secondary | ICD-10-CM | POA: Diagnosis not present

## 2017-07-18 ENCOUNTER — Other Ambulatory Visit: Payer: Self-pay | Admitting: Family Medicine

## 2017-07-18 ENCOUNTER — Other Ambulatory Visit: Payer: Self-pay

## 2017-07-18 DIAGNOSIS — M79662 Pain in left lower leg: Secondary | ICD-10-CM

## 2017-07-25 ENCOUNTER — Ambulatory Visit
Admission: RE | Admit: 2017-07-25 | Discharge: 2017-07-25 | Disposition: A | Discharge status: Home / Self Care | Payer: BLUE CROSS/BLUE SHIELD | Source: Ambulatory Visit | Attending: Family Medicine | Admitting: Family Medicine

## 2017-07-25 DIAGNOSIS — M79662 Pain in left lower leg: Secondary | ICD-10-CM

## 2017-07-25 DIAGNOSIS — S86112A Strain of other muscle(s) and tendon(s) of posterior muscle group at lower leg level, left leg, initial encounter: Secondary | ICD-10-CM | POA: Diagnosis not present

## 2017-09-04 DIAGNOSIS — D2261 Melanocytic nevi of right upper limb, including shoulder: Secondary | ICD-10-CM | POA: Diagnosis not present

## 2017-09-04 DIAGNOSIS — D2262 Melanocytic nevi of left upper limb, including shoulder: Secondary | ICD-10-CM | POA: Diagnosis not present

## 2017-09-04 DIAGNOSIS — L300 Nummular dermatitis: Secondary | ICD-10-CM | POA: Diagnosis not present

## 2017-09-04 DIAGNOSIS — L858 Other specified epidermal thickening: Secondary | ICD-10-CM | POA: Diagnosis not present

## 2018-01-22 DIAGNOSIS — Z01419 Encounter for gynecological examination (general) (routine) without abnormal findings: Secondary | ICD-10-CM | POA: Diagnosis not present

## 2018-01-22 DIAGNOSIS — Z6825 Body mass index (BMI) 25.0-25.9, adult: Secondary | ICD-10-CM | POA: Diagnosis not present

## 2018-01-22 DIAGNOSIS — Z1231 Encounter for screening mammogram for malignant neoplasm of breast: Secondary | ICD-10-CM | POA: Diagnosis not present

## 2018-03-04 DIAGNOSIS — Z23 Encounter for immunization: Secondary | ICD-10-CM | POA: Diagnosis not present

## 2018-05-20 DIAGNOSIS — L723 Sebaceous cyst: Secondary | ICD-10-CM | POA: Diagnosis not present

## 2018-05-27 DIAGNOSIS — B999 Unspecified infectious disease: Secondary | ICD-10-CM | POA: Diagnosis not present

## 2018-05-29 DIAGNOSIS — B999 Unspecified infectious disease: Secondary | ICD-10-CM | POA: Diagnosis not present

## 2018-07-15 DIAGNOSIS — E039 Hypothyroidism, unspecified: Secondary | ICD-10-CM | POA: Diagnosis not present

## 2018-09-02 DIAGNOSIS — M549 Dorsalgia, unspecified: Secondary | ICD-10-CM | POA: Diagnosis not present

## 2018-09-02 DIAGNOSIS — F17200 Nicotine dependence, unspecified, uncomplicated: Secondary | ICD-10-CM | POA: Diagnosis not present

## 2018-09-02 DIAGNOSIS — M25511 Pain in right shoulder: Secondary | ICD-10-CM | POA: Diagnosis not present

## 2018-09-16 DIAGNOSIS — M549 Dorsalgia, unspecified: Secondary | ICD-10-CM | POA: Diagnosis not present

## 2018-09-16 DIAGNOSIS — F17200 Nicotine dependence, unspecified, uncomplicated: Secondary | ICD-10-CM | POA: Diagnosis not present

## 2018-09-16 DIAGNOSIS — M25511 Pain in right shoulder: Secondary | ICD-10-CM | POA: Diagnosis not present

## 2018-09-28 DIAGNOSIS — M542 Cervicalgia: Secondary | ICD-10-CM | POA: Diagnosis not present

## 2018-10-05 DIAGNOSIS — M5412 Radiculopathy, cervical region: Secondary | ICD-10-CM | POA: Diagnosis not present

## 2019-01-13 DIAGNOSIS — E039 Hypothyroidism, unspecified: Secondary | ICD-10-CM | POA: Diagnosis not present

## 2019-01-13 DIAGNOSIS — Z23 Encounter for immunization: Secondary | ICD-10-CM | POA: Diagnosis not present

## 2019-02-08 ENCOUNTER — Other Ambulatory Visit: Payer: Self-pay

## 2019-02-08 DIAGNOSIS — Z20822 Contact with and (suspected) exposure to covid-19: Secondary | ICD-10-CM

## 2019-02-08 DIAGNOSIS — Z20828 Contact with and (suspected) exposure to other viral communicable diseases: Secondary | ICD-10-CM | POA: Diagnosis not present

## 2019-02-09 ENCOUNTER — Telehealth: Payer: Self-pay | Admitting: General Practice

## 2019-02-09 LAB — NOVEL CORONAVIRUS, NAA: SARS-CoV-2, NAA: NOT DETECTED

## 2019-02-09 NOTE — Telephone Encounter (Signed)
Negative COVID results given. Patient results "NOT Detected." Caller expressed understanding. ° °

## 2019-03-10 DIAGNOSIS — Z1231 Encounter for screening mammogram for malignant neoplasm of breast: Secondary | ICD-10-CM | POA: Diagnosis not present

## 2019-03-10 DIAGNOSIS — Z01419 Encounter for gynecological examination (general) (routine) without abnormal findings: Secondary | ICD-10-CM | POA: Diagnosis not present

## 2019-03-10 DIAGNOSIS — Z6826 Body mass index (BMI) 26.0-26.9, adult: Secondary | ICD-10-CM | POA: Diagnosis not present

## 2019-03-11 ENCOUNTER — Encounter (HOSPITAL_COMMUNITY): Payer: Self-pay | Admitting: Emergency Medicine

## 2019-03-11 ENCOUNTER — Other Ambulatory Visit: Payer: Self-pay

## 2019-03-11 ENCOUNTER — Emergency Department (HOSPITAL_COMMUNITY)
Admission: EM | Admit: 2019-03-11 | Discharge: 2019-03-12 | Disposition: A | Discharge status: Home / Self Care | Payer: BC Managed Care – PPO | Attending: Emergency Medicine | Admitting: Emergency Medicine

## 2019-03-11 ENCOUNTER — Emergency Department (HOSPITAL_COMMUNITY): Payer: BC Managed Care – PPO

## 2019-03-11 DIAGNOSIS — F17200 Nicotine dependence, unspecified, uncomplicated: Secondary | ICD-10-CM | POA: Insufficient documentation

## 2019-03-11 DIAGNOSIS — Y999 Unspecified external cause status: Secondary | ICD-10-CM | POA: Diagnosis not present

## 2019-03-11 DIAGNOSIS — Y92019 Unspecified place in single-family (private) house as the place of occurrence of the external cause: Secondary | ICD-10-CM | POA: Diagnosis not present

## 2019-03-11 DIAGNOSIS — Z79899 Other long term (current) drug therapy: Secondary | ICD-10-CM | POA: Insufficient documentation

## 2019-03-11 DIAGNOSIS — S8251XB Displaced fracture of medial malleolus of right tibia, initial encounter for open fracture type I or II: Secondary | ICD-10-CM | POA: Diagnosis not present

## 2019-03-11 DIAGNOSIS — Y93H1 Activity, digging, shoveling and raking: Secondary | ICD-10-CM | POA: Diagnosis not present

## 2019-03-11 DIAGNOSIS — S82891B Other fracture of right lower leg, initial encounter for open fracture type I or II: Secondary | ICD-10-CM | POA: Diagnosis not present

## 2019-03-11 DIAGNOSIS — R52 Pain, unspecified: Secondary | ICD-10-CM | POA: Diagnosis not present

## 2019-03-11 DIAGNOSIS — W010XXA Fall on same level from slipping, tripping and stumbling without subsequent striking against object, initial encounter: Secondary | ICD-10-CM | POA: Diagnosis not present

## 2019-03-11 DIAGNOSIS — R609 Edema, unspecified: Secondary | ICD-10-CM | POA: Diagnosis not present

## 2019-03-11 DIAGNOSIS — S99911A Unspecified injury of right ankle, initial encounter: Secondary | ICD-10-CM | POA: Diagnosis present

## 2019-03-11 DIAGNOSIS — S8261XB Displaced fracture of lateral malleolus of right fibula, initial encounter for open fracture type I or II: Secondary | ICD-10-CM | POA: Diagnosis not present

## 2019-03-11 DIAGNOSIS — S8261XA Displaced fracture of lateral malleolus of right fibula, initial encounter for closed fracture: Secondary | ICD-10-CM | POA: Diagnosis not present

## 2019-03-11 DIAGNOSIS — E039 Hypothyroidism, unspecified: Secondary | ICD-10-CM | POA: Diagnosis not present

## 2019-03-11 DIAGNOSIS — W19XXXA Unspecified fall, initial encounter: Secondary | ICD-10-CM | POA: Diagnosis not present

## 2019-03-11 MED ORDER — ONDANSETRON HCL 4 MG/2ML IJ SOLN
4.0000 mg | Freq: Once | INTRAMUSCULAR | Status: AC
  Administered 2019-03-12: 4 mg via INTRAVENOUS
  Filled 2019-03-11: qty 2

## 2019-03-11 MED ORDER — FENTANYL CITRATE (PF) 100 MCG/2ML IJ SOLN
50.0000 ug | Freq: Once | INTRAMUSCULAR | Status: AC
  Administered 2019-03-12: 50 ug via INTRAVENOUS
  Filled 2019-03-11: qty 2

## 2019-03-11 NOTE — ED Triage Notes (Signed)
Pt fell while sweeping patio with wet leave positive pain, deformity and edema right ankler

## 2019-03-11 NOTE — ED Provider Notes (Signed)
[facility] DEPT Provider Note   CSN: [id number] Arrival date & time: 03/11/19  2228     History   Chief Complaint Chief Complaint  Patient presents with  . Ankle Pain    HPI Carly [name] is a 43 y.o. female.     HPI   She presents for evaluation of injury to the right ankle.  She was outdoors, at home, sweeping leaves that were wet, when she slipped and fell injuring her right ankle.  She feels like she hit her right hip when she fell, possibly injuring it.  She denies head injury, neck pain or back pain.  There are no other known modifying factors.  Past Medical History:  Diagnosis Date  . AMA (advanced maternal age) multigravida 23+   . Depression    mild pp  . Eczema   . History of kidney stones   . Hypothyroidism   . Vaginal Pap smear, abnormal     Patient Active Problem List   Diagnosis Date Noted  . Pregnant 01/26/2014    Past Surgical History:  Procedure Laterality Date  . GYNECOLOGIC CRYOSURGERY    . WISDOM TOOTH EXTRACTION       OB History    Gravida  2   Para  2   Term  2   Preterm  0   AB  0   Living  2     SAB  0   TAB  0   Ectopic  0   Multiple  0   Live Births  2            Home Medications    Prior to Admission medications   Medication Sig Start Date End Date Taking? Authorizing Provider  levothyroxine (SYNTHROID) 100 MCG tablet Take 100 mcg by mouth daily before breakfast.   Yes [provider]  cephALEXin (KEFLEX) 500 MG capsule Take 1 capsule (500 mg total) by mouth 4 (four) times daily. 03/12/19   Daleen Bo, MD  HYDROcodone-acetaminophen (NORCO) 5-325 MG tablet Take 1 tablet by mouth every 4 (four) hours as needed for moderate pain. 03/12/19   Daleen Bo, MD  ibuprofen (ADVIL,MOTRIN) 800 MG tablet Take 1 tablet (800 mg total) by mouth every 8 (eight) hours as needed for moderate pain. Patient not taking: Reported on 03/11/2019 01/28/14   Juanda Chance, NP   oxyCODONE-acetaminophen (ROXICET) 5-325 MG per tablet Take 1-2 tablets by mouth every 6 (six) hours as needed for severe pain. Patient not taking: Reported on 03/11/2019 01/28/14   Everlene Farrier, MD    Family History Family History  Problem Relation Age of Onset  . Heart disease Maternal Grandfather   . Cancer Maternal Grandfather        skin  . Hypothyroidism Paternal Grandmother   . Anesthesia problems Neg Hx   . Hypotension Neg Hx   . Malignant hyperthermia Neg Hx   . Pseudochol deficiency Neg Hx     Social History Social History   Tobacco Use  . Smoking status: Current Every Day Smoker    Packs/day: 0.25  . Smokeless tobacco: Never Used  Substance Use Topics  . Alcohol use: No  . Drug use: No     Allergies   Patient has no known allergies.   Review of Systems Review of Systems  All other systems reviewed and are negative.    Physical Exam Updated Vital Signs BP (!) 136/91   Pulse 81   Temp 98.2 F (36.8 C) (Oral)  Resp 20   LMP 02/25/2019   SpO2 100%   Physical Exam Vitals signs and nursing note reviewed.  Constitutional:      Appearance: She is well-developed.  HENT:     Head: Normocephalic and atraumatic.  Eyes:     Conjunctiva/sclera: Conjunctivae normal.     Pupils: Pupils are equal, round, and reactive to light.  Neck:     Musculoskeletal: Normal range of motion and neck supple.     Trachea: Phonation normal.  Cardiovascular:     Rate and Rhythm: Normal rate.  Pulmonary:     Effort: Pulmonary effort is normal.  Abdominal:     Tenderness: There is no right CVA tenderness.  Musculoskeletal:     Comments: Right ankle, tender and swollen.  Skin defect, left medial ankle at the medial malleolus region, site of fracture.  No active bleeding at this site.  When touched she says this area feels like "a splinter."  Neurovascular intact distally in the toes of the right foot.  Skin:    General: Skin is warm and dry.  Neurological:     Mental  Status: She is alert and oriented to person, place, and time.     Motor: No abnormal muscle tone.  Psychiatric:        Mood and Affect: Mood normal.        Behavior: Behavior normal.        Thought Content: Thought content normal.        Judgment: Judgment normal.      ED Treatments / Results  Labs (all labs ordered are listed, but only abnormal results are displayed) Labs Reviewed - No data to display  EKG None  Radiology Dg Ankle Complete Right  Result Date: 03/11/2019 CLINICAL DATA:  Fall with pain and deformity EXAM: RIGHT ANKLE - COMPLETE 3+ VIEW COMPARISON:  None. FINDINGS: There is a horizontally oriented fracture seen through the medial malleolus with widening of the medial clear space measuring 6 mm. There is also comminuted mildly displaced fracture of the distal fibula at the ankle mortise. Diffuse soft tissue swelling is seen. IMPRESSION: Mildly displaced Weber C type fracture of the ankle with diffuse soft tissue swelling. Electronically Signed   By: Jonna Clark M.D.   On: 03/11/2019 23:14    Procedures Procedures (including critical care time)  Medications Ordered in ED Medications  HYDROcodone-acetaminophen (NORCO/VICODIN) 5-325 MG per tablet 1 tablet (has no administration in time range)  cephALEXin (KEFLEX) capsule 500 mg (has no administration in time range)  fentaNYL (SUBLIMAZE) injection 50 mcg (50 mcg Intravenous Given 03/12/19 0028)  ondansetron (ZOFRAN) injection 4 mg (4 mg Intravenous Given 03/12/19 0028)     Initial Impression / Assessment and Plan / ED Course  I have reviewed the triage vital signs and the nursing notes.  Pertinent labs & imaging results that were available during my care of the patient were reviewed by me and considered in my medical decision making (see chart for details).  Clinical Course as of Mar 11 106  Thu Mar 11, 2019  2338 Slightly displaced right ankle fracture, through medial and lateral malleoli.  Interpreted by me.   DG Ankle Complete Right [EW]  2345 Case discussed with orthopedics, Dr. Luiz Blare who advises covering skin defect with Xeroform, and apply compressive wrap with splint.  He will evaluate and treat the patient on Monday with likely surgical repair.   [EW]    Clinical Course User Index [EW] Mancel Bale, MD  Patient Vitals for the past 24 hrs:  BP Temp Temp src Pulse Resp SpO2  03/12/19 0100 (!) 136/91 - - - - -  03/12/19 0034 - - - 81 - 100 %  03/12/19 0032 135/82 - - 81 20 100 %  03/12/19 0000 107/70 - - 72 17 100 %  03/11/19 2329 - - - - - 100 %  03/11/19 2309 (!) 135/97 - - - - -  03/11/19 2254 137/90 98.2 F (36.8 C) Oral 74 18 100 %  03/11/19 2243 - - - - - 100 %    1:07 AM Reevaluation with update and discussion. After initial assessment and treatment, an updated evaluation reveals more comfortable after narcotic analgesia and placed in a splint by orthopedic technician. Mancel BaleElliott Marbeth Smedley   Medical Decision Making: Open fracture right ankle.  Mildly displaced.  Orthopedics consulted to assist with management and follow-up care.  Possible open fracture, mass-effect wound medial ankle, near site of malleolus fracture.  No active bleeding from this site.  CRITICAL CARE-no Performed by: Mancel BaleElliott Idrissa Beville  Nursing Notes Reviewed/ Care Coordinated Applicable Imaging Reviewed Interpretation of Laboratory Data incorporated into ED treatment  The patient appears reasonably screened and/or stabilized for discharge and I doubt any other medical condition or other [facility] requiring further screening, evaluation, or treatment in the ED at this time prior to discharge.  Plan: Home Medications-continue usual medications; Home Treatments-nonweightbearing right foot, elevation right leg.; return here if the recommended treatment, does not improve the symptoms; Recommended follow up-orthopedic follow-up 4 days for further management, possible surgery.    Final Clinical Impressions(s) / ED  Diagnoses   Final diagnoses:  Type I or II open fracture of right ankle, initial encounter    ED Discharge Orders         Ordered    HYDROcodone-acetaminophen (NORCO) 5-325 MG tablet  Every 4 hours PRN     03/12/19 0104    cephALEXin (KEFLEX) 500 MG capsule  4 times daily     03/12/19 0104           Mancel BaleWentz, Geran Haithcock, MD 03/12/19 [phone]

## 2019-03-12 DIAGNOSIS — M25571 Pain in right ankle and joints of right foot: Secondary | ICD-10-CM | POA: Diagnosis not present

## 2019-03-12 MED ORDER — CEPHALEXIN 500 MG PO CAPS: 500.0000 mg | ORAL_CAPSULE | Freq: Four times a day (QID) | ORAL | 0 refills | Status: AC

## 2019-03-12 MED ORDER — CEPHALEXIN 500 MG PO CAPS
500.0000 mg | ORAL_CAPSULE | Freq: Once | ORAL | Status: AC
  Administered 2019-03-12: 500 mg via ORAL
  Filled 2019-03-12: qty 1

## 2019-03-12 MED ORDER — HYDROCODONE-ACETAMINOPHEN 5-325 MG PO TABS: 1.0000 | ORAL_TABLET | ORAL | 0 refills | Status: AC | PRN

## 2019-03-12 MED ORDER — HYDROCODONE-ACETAMINOPHEN 5-325 MG PO TABS
1.0000 | ORAL_TABLET | Freq: Once | ORAL | Status: AC
  Administered 2019-03-12: 1 via ORAL
  Filled 2019-03-12: qty 1

## 2019-03-12 NOTE — Progress Notes (Signed)
Orthopedic Tech Progress Note Patient Details:  Carly [name] 08-Dec-1975 [id number]  Ortho Devices Type of Ortho Device: Post (short leg) splint Ortho Device/Splint Interventions: Ordered, Application   Post Interventions Patient Tolerated: Well Instructions Provided: Care of device, Adjustment of device   Melony Overly T 03/12/2019, 12:29 AM

## 2019-03-12 NOTE — Discharge Instructions (Signed)
You have fractured 2 bones in your ankle, the tibia and the fibula.  There may be an opening to the fracture of the tibia.  We are placing you on an antibiotic to help prevent infection.  Call the orthopedic doctor for follow-up, next Monday.  To help the pain elevate your right foot above your heart as much as possible.  Also use ice packs on the sore area for 5 times a day for about an hour.  Do not bear weight on the right foot.  Return here, if needed, for problems.

## 2019-03-12 NOTE — ED Notes (Signed)
Ortho tech at bedside 

## 2019-03-15 DIAGNOSIS — X58XXXA Exposure to other specified factors, initial encounter: Secondary | ICD-10-CM | POA: Diagnosis not present

## 2019-03-15 DIAGNOSIS — Y999 Unspecified external cause status: Secondary | ICD-10-CM | POA: Diagnosis not present

## 2019-03-15 DIAGNOSIS — S82841A Displaced bimalleolar fracture of right lower leg, initial encounter for closed fracture: Secondary | ICD-10-CM | POA: Diagnosis not present

## 2019-03-29 DIAGNOSIS — S82841A Displaced bimalleolar fracture of right lower leg, initial encounter for closed fracture: Secondary | ICD-10-CM | POA: Diagnosis not present

## 2019-04-13 DIAGNOSIS — S82841A Displaced bimalleolar fracture of right lower leg, initial encounter for closed fracture: Secondary | ICD-10-CM | POA: Diagnosis not present

## 2019-04-27 DIAGNOSIS — M25571 Pain in right ankle and joints of right foot: Secondary | ICD-10-CM | POA: Diagnosis not present

## 2019-04-27 DIAGNOSIS — M25371 Other instability, right ankle: Secondary | ICD-10-CM | POA: Diagnosis not present

## 2019-04-28 DIAGNOSIS — M25571 Pain in right ankle and joints of right foot: Secondary | ICD-10-CM | POA: Diagnosis not present

## 2019-04-28 DIAGNOSIS — M25371 Other instability, right ankle: Secondary | ICD-10-CM | POA: Diagnosis not present

## 2019-05-04 DIAGNOSIS — S82841A Displaced bimalleolar fracture of right lower leg, initial encounter for closed fracture: Secondary | ICD-10-CM | POA: Diagnosis not present

## 2019-05-04 DIAGNOSIS — M25571 Pain in right ankle and joints of right foot: Secondary | ICD-10-CM | POA: Diagnosis not present

## 2019-05-04 DIAGNOSIS — Z9889 Other specified postprocedural states: Secondary | ICD-10-CM | POA: Diagnosis not present

## 2019-05-04 DIAGNOSIS — M25371 Other instability, right ankle: Secondary | ICD-10-CM | POA: Diagnosis not present

## 2019-05-06 DIAGNOSIS — M25571 Pain in right ankle and joints of right foot: Secondary | ICD-10-CM | POA: Diagnosis not present

## 2019-05-06 DIAGNOSIS — M25371 Other instability, right ankle: Secondary | ICD-10-CM | POA: Diagnosis not present

## 2019-05-11 DIAGNOSIS — M25371 Other instability, right ankle: Secondary | ICD-10-CM | POA: Diagnosis not present

## 2019-05-11 DIAGNOSIS — M25571 Pain in right ankle and joints of right foot: Secondary | ICD-10-CM | POA: Diagnosis not present

## 2019-05-13 DIAGNOSIS — M25371 Other instability, right ankle: Secondary | ICD-10-CM | POA: Diagnosis not present

## 2019-05-13 DIAGNOSIS — M25571 Pain in right ankle and joints of right foot: Secondary | ICD-10-CM | POA: Diagnosis not present

## 2019-05-18 DIAGNOSIS — M25371 Other instability, right ankle: Secondary | ICD-10-CM | POA: Diagnosis not present

## 2019-05-18 DIAGNOSIS — M25571 Pain in right ankle and joints of right foot: Secondary | ICD-10-CM | POA: Diagnosis not present

## 2019-05-20 DIAGNOSIS — M25571 Pain in right ankle and joints of right foot: Secondary | ICD-10-CM | POA: Diagnosis not present

## 2019-05-20 DIAGNOSIS — M25371 Other instability, right ankle: Secondary | ICD-10-CM | POA: Diagnosis not present

## 2019-05-25 DIAGNOSIS — M25371 Other instability, right ankle: Secondary | ICD-10-CM | POA: Diagnosis not present

## 2019-05-25 DIAGNOSIS — M25571 Pain in right ankle and joints of right foot: Secondary | ICD-10-CM | POA: Diagnosis not present

## 2019-05-27 DIAGNOSIS — M25371 Other instability, right ankle: Secondary | ICD-10-CM | POA: Diagnosis not present

## 2019-05-27 DIAGNOSIS — M25571 Pain in right ankle and joints of right foot: Secondary | ICD-10-CM | POA: Diagnosis not present

## 2019-06-01 DIAGNOSIS — Z9889 Other specified postprocedural states: Secondary | ICD-10-CM | POA: Diagnosis not present

## 2019-06-02 DIAGNOSIS — M25371 Other instability, right ankle: Secondary | ICD-10-CM | POA: Diagnosis not present

## 2019-06-02 DIAGNOSIS — M25571 Pain in right ankle and joints of right foot: Secondary | ICD-10-CM | POA: Diagnosis not present

## 2019-06-03 DIAGNOSIS — M25371 Other instability, right ankle: Secondary | ICD-10-CM | POA: Diagnosis not present

## 2019-06-03 DIAGNOSIS — M25571 Pain in right ankle and joints of right foot: Secondary | ICD-10-CM | POA: Diagnosis not present

## 2019-06-08 DIAGNOSIS — M25571 Pain in right ankle and joints of right foot: Secondary | ICD-10-CM | POA: Diagnosis not present

## 2019-06-08 DIAGNOSIS — M25371 Other instability, right ankle: Secondary | ICD-10-CM | POA: Diagnosis not present

## 2019-06-10 DIAGNOSIS — M25371 Other instability, right ankle: Secondary | ICD-10-CM | POA: Diagnosis not present

## 2019-06-10 DIAGNOSIS — M25571 Pain in right ankle and joints of right foot: Secondary | ICD-10-CM | POA: Diagnosis not present

## 2019-07-13 DIAGNOSIS — Z09 Encounter for follow-up examination after completed treatment for conditions other than malignant neoplasm: Secondary | ICD-10-CM | POA: Diagnosis not present

## 2019-07-13 DIAGNOSIS — M25571 Pain in right ankle and joints of right foot: Secondary | ICD-10-CM | POA: Diagnosis not present

## 2019-08-24 DIAGNOSIS — M25571 Pain in right ankle and joints of right foot: Secondary | ICD-10-CM | POA: Diagnosis not present

## 2019-09-06 DIAGNOSIS — H1013 Acute atopic conjunctivitis, bilateral: Secondary | ICD-10-CM | POA: Diagnosis not present

## 2019-10-14 DIAGNOSIS — M79671 Pain in right foot: Secondary | ICD-10-CM | POA: Diagnosis not present

## 2019-10-14 DIAGNOSIS — M722 Plantar fascial fibromatosis: Secondary | ICD-10-CM | POA: Diagnosis not present

## 2019-11-23 DIAGNOSIS — M722 Plantar fascial fibromatosis: Secondary | ICD-10-CM | POA: Diagnosis not present

## 2020-01-10 DIAGNOSIS — H8110 Benign paroxysmal vertigo, unspecified ear: Secondary | ICD-10-CM | POA: Diagnosis not present

## 2020-01-16 IMAGING — US US EXTREM LOW*L* LIMITED
1 series · 6 of 6 positions shown · non-contrast
Comparison: None.

CLINICAL DATA: Left posterior calf pain after feeling a pop 2 weeks
ago. Symptoms have slowly improved since then.

EXAM:
ULTRASOUND LEFT LOWER EXTREMITY LIMITED
TECHNIQUE: Ultrasound examination of the lower extremity soft tissues was
performed in the area of clinical concern.

[Series 1: us extrem low*left* limited · 0.06mm/px · 6 acquisitions, 6 frames shown]
[im 1/6]
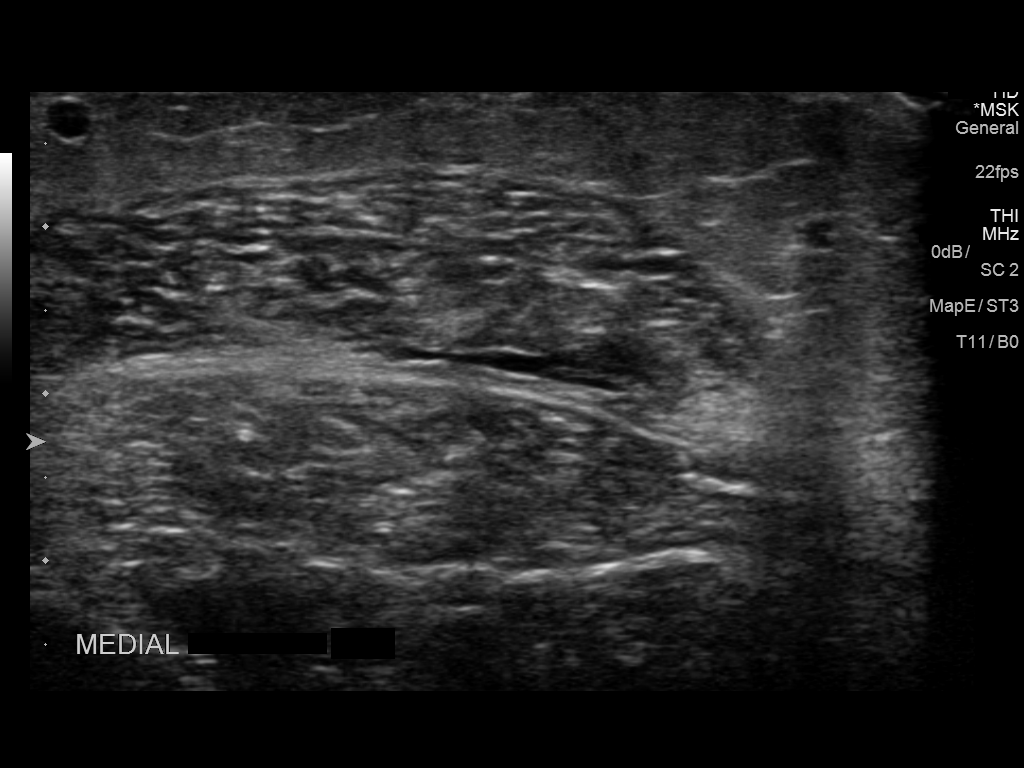
[im 2/6]
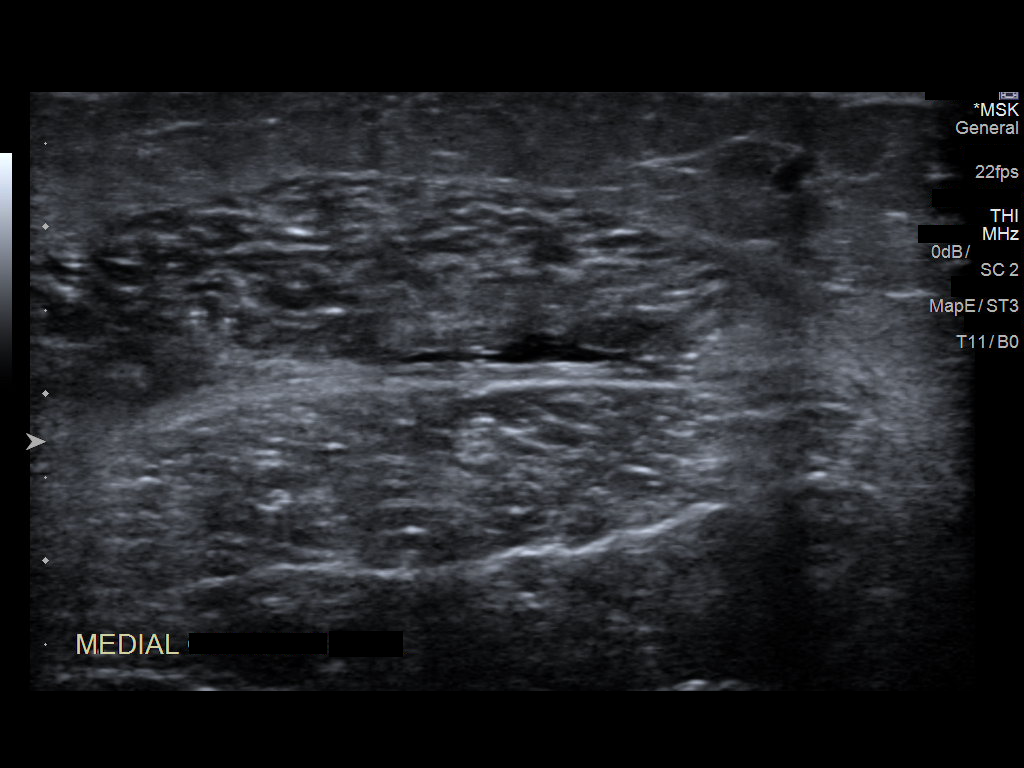
[im 3/6]
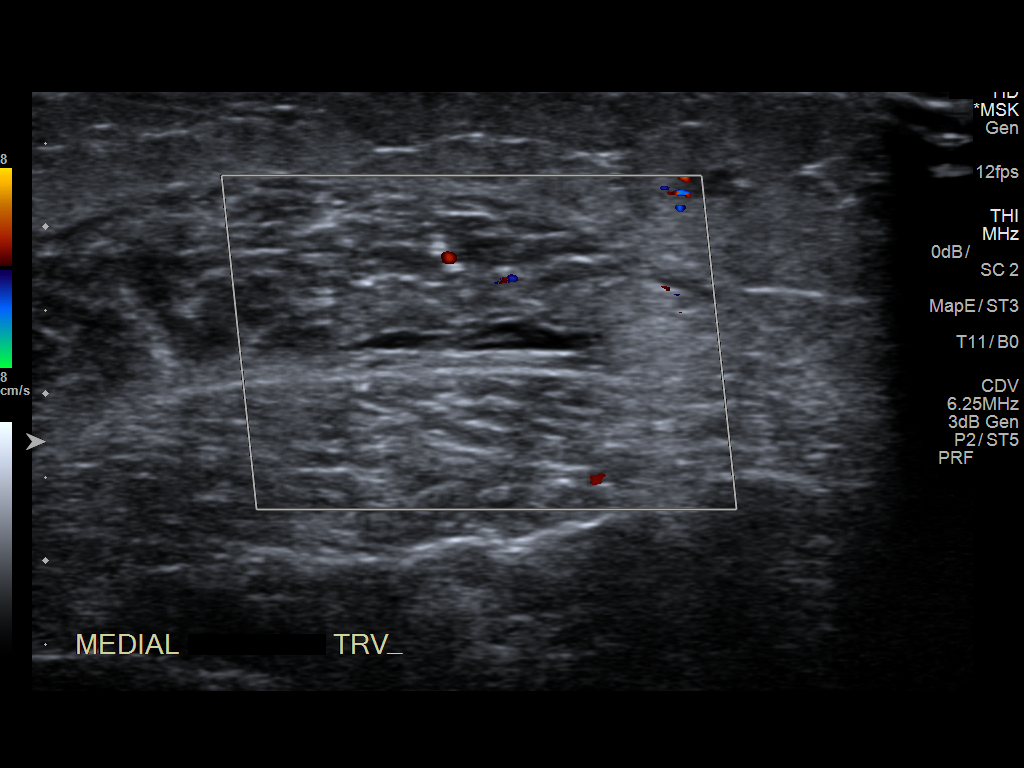
[im 4/6]
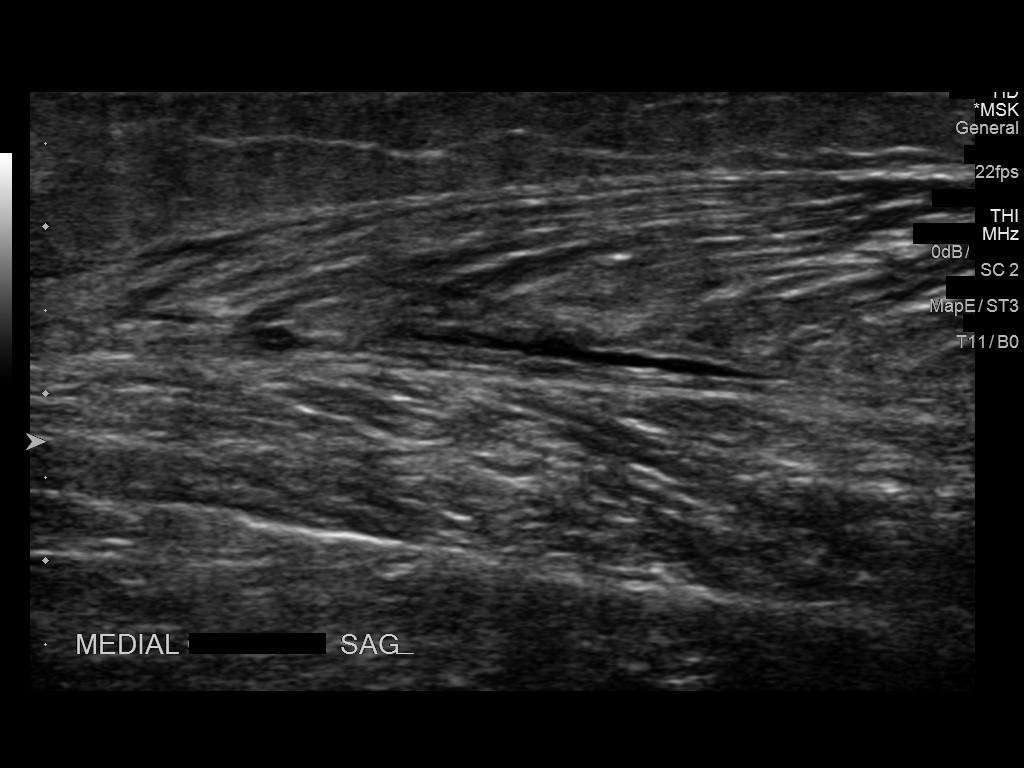
[im 5/6]
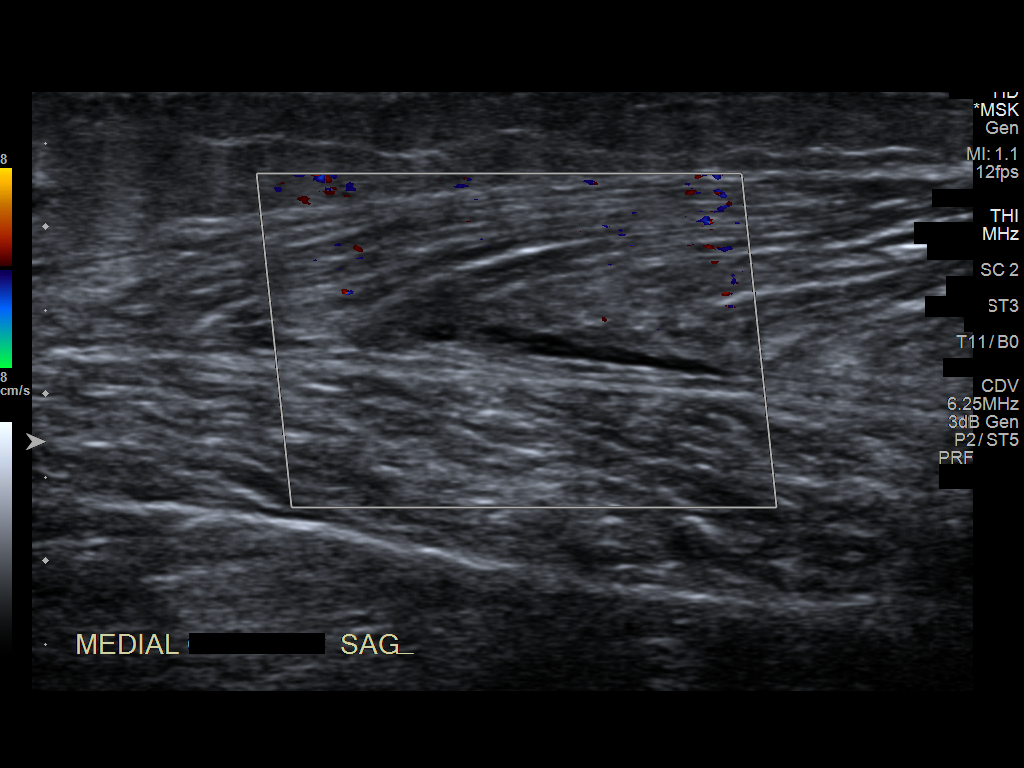
[im 6/6]
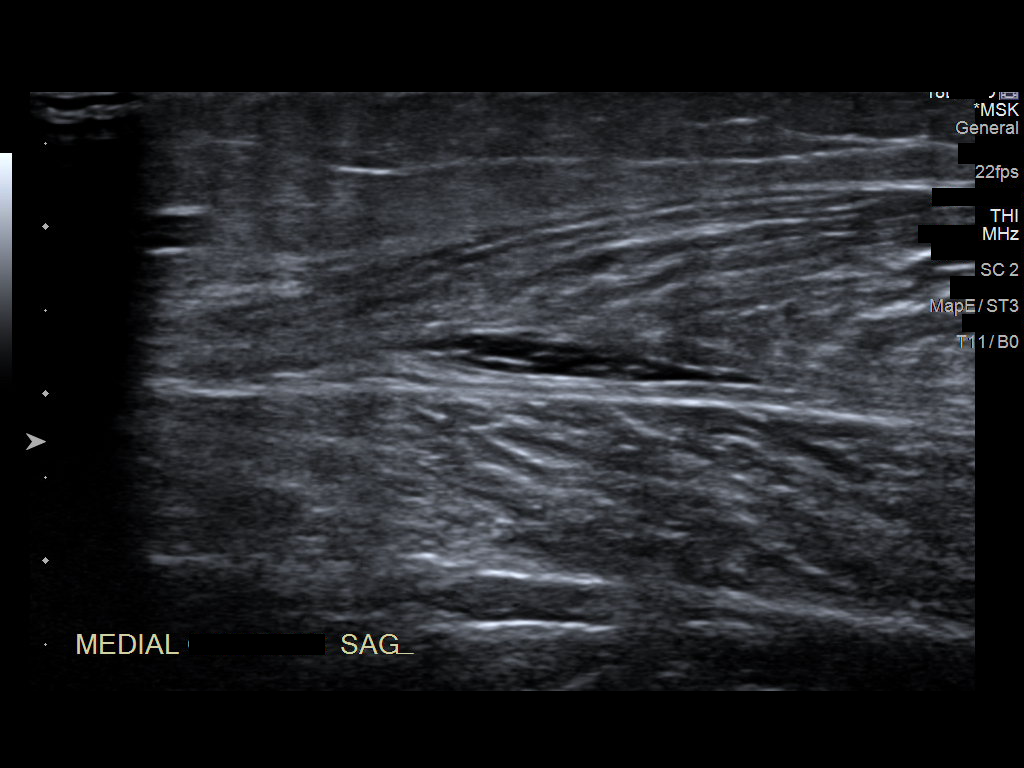

[6 of 6 positions shown; findings below may reference images not displayed]

FINDINGS: There is a small area of hypoechoic fluid within the left mid to
distal medial gastrocnemius muscle, consistent with low-grade
partial tear. The tear measures approximately 2.2 cm in length. No
high-grade muscle injury.
IMPRESSION: 1. Small focal low-grade partial tear of the left mid to distal
medial gastrocnemius muscle.

## 2020-01-25 DIAGNOSIS — M545 Low back pain: Secondary | ICD-10-CM | POA: Diagnosis not present

## 2020-01-25 DIAGNOSIS — M542 Cervicalgia: Secondary | ICD-10-CM | POA: Diagnosis not present

## 2020-02-28 DIAGNOSIS — E039 Hypothyroidism, unspecified: Secondary | ICD-10-CM | POA: Diagnosis not present

## 2020-02-28 DIAGNOSIS — M778 Other enthesopathies, not elsewhere classified: Secondary | ICD-10-CM | POA: Diagnosis not present

## 2020-02-28 DIAGNOSIS — Z791 Long term (current) use of non-steroidal anti-inflammatories (NSAID): Secondary | ICD-10-CM | POA: Diagnosis not present

## 2020-02-29 DIAGNOSIS — M25512 Pain in left shoulder: Secondary | ICD-10-CM | POA: Diagnosis not present

## 2020-02-29 DIAGNOSIS — M722 Plantar fascial fibromatosis: Secondary | ICD-10-CM | POA: Diagnosis not present

## 2020-03-27 DIAGNOSIS — L820 Inflamed seborrheic keratosis: Secondary | ICD-10-CM | POA: Diagnosis not present

## 2020-05-01 DIAGNOSIS — Z1231 Encounter for screening mammogram for malignant neoplasm of breast: Secondary | ICD-10-CM | POA: Diagnosis not present

## 2020-05-01 DIAGNOSIS — Z01419 Encounter for gynecological examination (general) (routine) without abnormal findings: Secondary | ICD-10-CM | POA: Diagnosis not present

## 2020-05-01 DIAGNOSIS — Z6826 Body mass index (BMI) 26.0-26.9, adult: Secondary | ICD-10-CM | POA: Diagnosis not present

## 2020-05-30 DIAGNOSIS — M25512 Pain in left shoulder: Secondary | ICD-10-CM | POA: Diagnosis not present

## 2020-05-30 DIAGNOSIS — M722 Plantar fascial fibromatosis: Secondary | ICD-10-CM | POA: Diagnosis not present

## 2020-05-30 DIAGNOSIS — M79671 Pain in right foot: Secondary | ICD-10-CM | POA: Diagnosis not present

## 2020-06-06 DIAGNOSIS — M25512 Pain in left shoulder: Secondary | ICD-10-CM | POA: Diagnosis not present

## 2020-06-08 DIAGNOSIS — M67912 Unspecified disorder of synovium and tendon, left shoulder: Secondary | ICD-10-CM | POA: Diagnosis not present

## 2020-06-08 DIAGNOSIS — M25512 Pain in left shoulder: Secondary | ICD-10-CM | POA: Diagnosis not present

## 2020-06-20 DIAGNOSIS — M722 Plantar fascial fibromatosis: Secondary | ICD-10-CM | POA: Diagnosis not present

## 2020-07-14 DIAGNOSIS — M6281 Muscle weakness (generalized): Secondary | ICD-10-CM | POA: Diagnosis not present

## 2020-07-14 DIAGNOSIS — S46012D Strain of muscle(s) and tendon(s) of the rotator cuff of left shoulder, subsequent encounter: Secondary | ICD-10-CM | POA: Diagnosis not present

## 2021-02-27 DIAGNOSIS — E039 Hypothyroidism, unspecified: Secondary | ICD-10-CM | POA: Diagnosis not present

## 2021-02-27 DIAGNOSIS — F411 Generalized anxiety disorder: Secondary | ICD-10-CM | POA: Diagnosis not present

## 2021-02-27 DIAGNOSIS — Z23 Encounter for immunization: Secondary | ICD-10-CM | POA: Diagnosis not present

## 2021-02-27 DIAGNOSIS — I10 Essential (primary) hypertension: Secondary | ICD-10-CM | POA: Diagnosis not present

## 2021-06-04 DIAGNOSIS — Z1231 Encounter for screening mammogram for malignant neoplasm of breast: Secondary | ICD-10-CM | POA: Diagnosis not present

## 2021-06-04 DIAGNOSIS — Z01419 Encounter for gynecological examination (general) (routine) without abnormal findings: Secondary | ICD-10-CM | POA: Diagnosis not present

## 2021-06-04 DIAGNOSIS — Z6826 Body mass index (BMI) 26.0-26.9, adult: Secondary | ICD-10-CM | POA: Diagnosis not present

## 2021-08-27 DIAGNOSIS — K642 Third degree hemorrhoids: Secondary | ICD-10-CM | POA: Diagnosis not present

## 2021-08-27 DIAGNOSIS — K641 Second degree hemorrhoids: Secondary | ICD-10-CM | POA: Diagnosis not present

## 2021-08-27 DIAGNOSIS — F419 Anxiety disorder, unspecified: Secondary | ICD-10-CM | POA: Diagnosis not present

## 2021-10-29 DIAGNOSIS — M5451 Vertebrogenic low back pain: Secondary | ICD-10-CM | POA: Diagnosis not present

## 2021-11-26 DIAGNOSIS — M5451 Vertebrogenic low back pain: Secondary | ICD-10-CM | POA: Diagnosis not present

## 2021-12-14 DIAGNOSIS — M5451 Vertebrogenic low back pain: Secondary | ICD-10-CM | POA: Diagnosis not present

## 2022-03-25 DIAGNOSIS — L720 Epidermal cyst: Secondary | ICD-10-CM | POA: Diagnosis not present

## 2022-07-31 LAB — COLOGUARD: COLOGUARD: NEGATIVE

## 2023-12-30 ENCOUNTER — Ambulatory Visit (INDEPENDENT_AMBULATORY_CARE_PROVIDER_SITE_OTHER)

## 2023-12-30 ENCOUNTER — Encounter: Payer: Self-pay | Admitting: Podiatry

## 2023-12-30 ENCOUNTER — Ambulatory Visit: Admitting: Podiatry

## 2023-12-30 VITALS — Ht 71.0 in | Wt 194.0 lb

## 2023-12-30 DIAGNOSIS — M7741 Metatarsalgia, right foot: Secondary | ICD-10-CM | POA: Diagnosis not present

## 2023-12-30 DIAGNOSIS — M24571 Contracture, right ankle: Secondary | ICD-10-CM

## 2023-12-30 DIAGNOSIS — M7751 Other enthesopathy of right foot: Secondary | ICD-10-CM | POA: Diagnosis not present

## 2023-12-30 NOTE — Patient Instructions (Addendum)
 VISIT SUMMARY: Today, we discussed the painful callus on your right foot and your right ankle stiffness following your previous ankle surgery. We reviewed your history of plantar fasciitis and the impact of your altered walking patterns on your current foot issues.  YOUR PLAN: -PAINFUL RIGHT SUB-METATARSAL CALLUS (MECHANICAL HYPERKERATOSIS) WITH METATARSALGIA: This condition involves a painful thickened area of skin on the bottom of your foot due to mechanical friction and pressure. We will debride the callus and apply salicylic acid for 24 hours. You will be fitted for new custom molded orthoses with a metatarsal pad and sub-metatarsal unloads. Additionally, we recommend wearing shoes that provide maximum cushioning and minimizing barefoot time.  -RIGHT ANKLE STIFFNESS AND LIMITED DORSIFLEXION, STATUS POST SURGICAL REPAIR OF PRIOR FRACTURE: This stiffness and limited movement in your right ankle are due to scar tissue from your previous surgery, affecting your gait and contributing to the callus formation. We recommend calf stretching exercises and using an elliptical machine to improve ankle flexibility. If these measures do not help, long-term surgical options like calf muscle lengthening may be considered.  INSTRUCTIONS: Please follow up with us  if you experience any worsening symptoms or if the current treatment plan does not provide relief. Continue with the recommended exercises and footwear adjustments, and let us  know if you need any further assistance or adjustments to your orthotics.                      Contains text generated by [employer].                      Do exercises exactly as told by your health care provider and adjust them as directed. It is normal to feel mild stretching, pulling, tightness, or discomfort as you do these exercises. Stop the exercise right away if you feel sudden pain or your pain gets worse.   Stretching exercises These  exercises improve the movement and flexibility of your calf muscles. These exercises may also help to relieve pain and stiffness. Standing gastroc stretch  This exercise is also called a standing calf (gastroc) stretch. Stand with your hands against a wall. Extend your left / right leg behind you, and bend your front knee slightly. Your heels should be on the floor. Keeping your heels on the floor and your back knee straight, shift your weight toward the wall. You should feel a gentle stretch in the back of your lower leg (calf). Hold this position for 10 seconds. Repeat 10 times. Complete this exercise 2 times a day. Gastroc and soleus stretch, standing This is an exercise in which you stand on a step and use your body weight to stretch your calf muscles. To do this exercise: Stand with the ball of your left / right foot on a step. The ball of your foot is on the walking surface, right under your toes. Keep your other foot firmly on the same step. Hold on to the wall, a railing, or a chair for balance. Slowly lift your other foot, allowing your body weight to press your left / right heel down over the edge of the step. You should feel a stretch in your left / right calf. Hold this position for 10 seconds. Return both feet to the step. Repeat this exercise with a slight bend in your left / right knee. Repeat 10 times. Complete this exercise 2 times a day. Strengthening exercise This exercise builds strength and endurance in your foot muscles and  may help to take pressure off your heel. Endurance is the ability to use your muscles for a long time, even after they get tired. Arch lifts This exercise is sometimes called foot intrinsics. This is an exercise in which you lift the arch part of your foot only. To do this exercise: Sit in a chair with your feet flat on the floor. Keeping your big toe and your heel on the floor, lift only your arch, which is on the inner edge of your left / right  foot. Do not move your knee or scrunch your toes. This is a small movement. Hold this position for 10 seconds. Return to the starting position. Repeat 10 times. Complete this exercise 2 times a day.

## 2023-12-30 NOTE — Progress Notes (Signed)
 Subjective:  Patient ID: Carly [name], female    DOB: [birth year],  MRN: [id number]  Chief Complaint  Patient presents with   Foot Pain    RM 1 Patient is here for pain on the ball of the right foot. Patient has a callus on the ball of the foot that ma be contributing to the pain Patient is using custom orthotics.    Discussed the use of AI scribe software for clinical note transcription with the patient, who gave verbal consent to proceed.  History of Present Illness Carly [name] is a 48 year old female with plantar fasciitis and prior ankle surgery who presents with a painful callus on her right foot.  She has been experiencing a painful callus on her right foot, described as 'excruciating at times.' The callus is located on the sub metatarsal area and is more painful on the right foot compared to the left. She has been managing it for a couple of years by shaving it down and using callus medicine and pads, but these measures have not provided lasting relief.  She has a history of plantar fasciitis, which she believes may have contributed to the development of the callus due to altered walking patterns. She has tried various types of supportive footwear, including Bufos, Birkenstocks, and Atrix, and has also used orthotics made at [employer] [facility] for her plantar fasciitis. She is not consistently wearing her orthotics due to footwear limitations. She avoids going barefoot except at night when necessary.  In 2020, she sustained an ankle fracture and underwent surgery. She notes that her right ankle is tighter than the left, likely due to scar tissue from the surgery and the fracture. This tightness affects her gait, causing her to put more pressure on the front of her foot. She has participated in physical therapy following her ankle surgery and has continued to engage in therapy intermittently. She mentions difficulty in spreading her toes apart, which she attributes to either  the callus or compensatory mechanisms for balance.      Objective:    Physical Exam VASCULAR: DP and PT pulse palpable. Foot is warm and well-perfused. Capillary fill time is brisk. DERMATOLOGIC: Normal skin turgor, texture, and temperature. No open lesions, rashes, or ulcerations. NEUROLOGIC: Normal sensation to light touch and pressure. No paresthesias. ORTHOPEDIC: Sub metatarsal one and five callus bilaterally, right worse than left. Limited range of motion at the right ankle and dorsiflexion compared to the left. Right foot alignment normal on radiograph. No ecchymosis or bruising. No gross deformity. No pain to palpation.   No images are attached to the encounter.    Results RADIOLOGY Right foot radiograph: Relatively normal foot alignment, elongated second metatarsal, previous ankle ORIF with fibular and multimolecular fixation present, no fracture or stress fracture, suspension appears normal (12/30/2023)  Procedure: Debridement of callus Description: Debrided the callus of hyperkeratosis. Applied salicylic acid to the area.   Assessment:   1. Metatarsalgia, right foot   2. Equinus contracture of right ankle      Plan:  Patient was evaluated and treated and all questions answered.  Assessment and Plan Assessment & Plan Painful right sub-metatarsal callus (mechanical hyperkeratosis) with metatarsalgia Chronic painful callus at the right sub-metatarsal area, associated with metatarsalgia, due to mechanical friction and pressure exacerbated by structural foot issues. Differential diagnosis includes a wart, but presentation is consistent with a mechanical callus. Radiographs show an elongated second metatarsal contributing to pressure and callus formation. The callus is unresponsive to  over-the-counter treatments and recurs due to underlying structural issues. Surgical options like bone elevation are considered a last resort due to the functional importance of the bones  involved. - Debride the callus and apply salicylic acid for 24 hours. - Fit for new custom molded orthoses with a metatarsal pad and sub-metatarsal one and five unloads. -Dancers pad applied today to insole to relieve this in the interim until we receive her orthotics - Recommend shoes that maximize cushion and minimize barefoot time.  Right ankle stiffness and limited dorsiflexion, status post surgical repair of prior fracture Right ankle stiffness and limited dorsiflexion due to scar tissue from previous surgical repair of an ankle fracture, resulting in altered gait mechanics and increased forefoot pressure contributing to callus formation. The condition is chronic and related to the previous injury and surgery. Long-term surgical options like calf muscle lengthening are considered only if conservative measures fail. - Provide calf stretching exercises to improve ankle flexibility. - Consider long-term option of calf muscle lengthening if conservative measures fail. - Encourage use of elliptical machine to improve ankle flexibility.      Return if symptoms worsen or fail to improve.

## 2024-01-23 ENCOUNTER — Ambulatory Visit (INDEPENDENT_AMBULATORY_CARE_PROVIDER_SITE_OTHER)

## 2024-01-23 DIAGNOSIS — M24571 Contracture, right ankle: Secondary | ICD-10-CM | POA: Diagnosis not present

## 2024-01-23 DIAGNOSIS — M2142 Flat foot [pes planus] (acquired), left foot: Secondary | ICD-10-CM | POA: Diagnosis not present

## 2024-01-23 DIAGNOSIS — M2141 Flat foot [pes planus] (acquired), right foot: Secondary | ICD-10-CM | POA: Diagnosis not present

## 2024-01-23 DIAGNOSIS — M7741 Metatarsalgia, right foot: Secondary | ICD-10-CM | POA: Diagnosis not present

## 2024-01-23 NOTE — Progress Notes (Signed)
 Orthotics   Patient was present and evaluated for Custom molded foot orthotics. Patient will benefit from CFO's to provide total contact to BIL MLA's helping to balance and distribute body weight more evenly across BIL feet helping to reduce plantar pressure and pain. Orthotic will also encourage FF / RF alignment  Patient was scanned today and will return for fitting upon receipt  LLD left shorter approx 1/8-1/4  Left spacer given to wear under

## 2024-03-30 ENCOUNTER — Ambulatory Visit (INDEPENDENT_AMBULATORY_CARE_PROVIDER_SITE_OTHER): Admitting: Podiatrist

## 2024-03-30 DIAGNOSIS — M2142 Flat foot [pes planus] (acquired), left foot: Secondary | ICD-10-CM

## 2024-03-30 DIAGNOSIS — M2141 Flat foot [pes planus] (acquired), right foot: Secondary | ICD-10-CM

## 2024-03-30 DIAGNOSIS — M7741 Metatarsalgia, right foot: Secondary | ICD-10-CM

## 2024-03-30 DIAGNOSIS — M24571 Contracture, right ankle: Secondary | ICD-10-CM

## 2024-03-30 NOTE — Progress Notes (Signed)
Patient presents today to pick up orthotics. They are noted to contour the arch and foot nicely and fit well in the shoes.  Break in period recommended and instructions for wear given.    

## 2024-04-02 ENCOUNTER — Other Ambulatory Visit
